# Patient Record
Sex: Male | Born: 1965 | Race: White | Hispanic: No | Marital: Single | Smoking: Current every day smoker
Health system: Southern US, Community
[De-identification: ages and names within clinical notes are randomized; demographics above are authoritative.]

## PROBLEM LIST (undated history)

## (undated) DIAGNOSIS — M542 Cervicalgia: Secondary | ICD-10-CM

## (undated) DIAGNOSIS — M549 Dorsalgia, unspecified: Secondary | ICD-10-CM

---

## 2005-06-24 ENCOUNTER — Emergency Department: Payer: Self-pay | Admitting: General Practice

## 2005-06-29 ENCOUNTER — Ambulatory Visit: Payer: Self-pay

## 2005-08-13 ENCOUNTER — Ambulatory Visit: Payer: Self-pay | Admitting: Physician Assistant

## 2005-08-31 ENCOUNTER — Encounter: Payer: Self-pay | Admitting: Physician Assistant

## 2005-09-30 ENCOUNTER — Encounter: Payer: Self-pay | Admitting: Physician Assistant

## 2008-09-13 ENCOUNTER — Emergency Department (HOSPITAL_COMMUNITY): Admission: EM | Admit: 2008-09-13 | Discharge: 2008-09-13 | Payer: Self-pay | Admitting: Emergency Medicine

## 2008-09-17 ENCOUNTER — Encounter: Payer: Self-pay | Admitting: Family Medicine

## 2008-09-30 ENCOUNTER — Encounter: Payer: Self-pay | Admitting: Family Medicine

## 2008-10-30 ENCOUNTER — Encounter: Payer: Self-pay | Admitting: Family Medicine

## 2011-08-18 ENCOUNTER — Emergency Department (HOSPITAL_COMMUNITY)
Admission: EM | Admit: 2011-08-18 | Discharge: 2011-08-18 | Disposition: A | Discharge status: Home / Self Care | Payer: Medicaid Other | Attending: Emergency Medicine | Admitting: Emergency Medicine

## 2011-08-18 DIAGNOSIS — S39012A Strain of muscle, fascia and tendon of lower back, initial encounter: Secondary | ICD-10-CM

## 2011-08-18 DIAGNOSIS — M545 Low back pain, unspecified: Secondary | ICD-10-CM | POA: Insufficient documentation

## 2011-08-18 DIAGNOSIS — X500XXA Overexertion from strenuous movement or load, initial encounter: Secondary | ICD-10-CM | POA: Insufficient documentation

## 2011-08-18 DIAGNOSIS — S335XXA Sprain of ligaments of lumbar spine, initial encounter: Secondary | ICD-10-CM | POA: Insufficient documentation

## 2011-08-18 LAB — BASIC METABOLIC PANEL
BUN: 8 mg/dL (ref 6–23)
Calcium: 9.4 mg/dL (ref 8.4–10.5)
GFR calc non Af Amer: >60 mL/min (ref >60)
Glucose, Bld: 94 mg/dL (ref 70–99)

## 2011-08-18 NOTE — ED Notes (Signed)
See down time chart

## 2011-08-18 NOTE — ED Provider Notes (Signed)
History     CSN: [id number] Arrival date & time: 08/18/2011  8:41 AM   No chief complaint on file.    (Include location/radiation/quality/duration/timing/severity/associated sxs/prior treatment) The history is provided by the patient.     No past medical history on file.   No past surgical history on file.  No family history on file.  History  Substance Use Topics  . Smoking status: Not on file  . Smokeless tobacco: Not on file  . Alcohol Use: Not on file      Review of Systems  Allergies  Review of patient's allergies indicates not on file.  Home Medications  No current outpatient prescriptions on file.  Physical Exam    There were no vitals taken for this visit.  Physical Exam  ED Course  Procedures  Results for orders placed during the hospital encounter of 08/18/11  BASIC METABOLIC PANEL      Component Value Range   Sodium 139  135 - 145 (mEq/L)   Potassium 4.6  3.5 - 5.1 (mEq/L)   Chloride 104  96 - 112 (mEq/L)   CO2 29  19 - 32 (mEq/L)   Glucose, Bld 94  70 - 99 (mg/dL)   BUN 8  6 - 23 (mg/dL)   Creatinine, Ser 8.29  0.50 - 1.35 (mg/dL)   Calcium 9.4  8.4 - 56.2 (mg/dL)   GFR calc non Af Amer >60  >60 (mL/min)   GFR calc Af Amer >60  >60 (mL/min)   No results found.   No diagnosis found.   MDM See downtime documentation paperwork for this hpi and other charting.       Candis Musa, PA 08/18/11 1057

## 2012-02-04 ENCOUNTER — Emergency Department (HOSPITAL_COMMUNITY)
Admission: EM | Admit: 2012-02-04 | Discharge: 2012-02-04 | Disposition: A | Discharge status: Home / Self Care | Payer: Medicaid Other | Attending: Emergency Medicine | Admitting: Emergency Medicine

## 2012-02-04 ENCOUNTER — Encounter (HOSPITAL_COMMUNITY): Payer: Self-pay

## 2012-02-04 DIAGNOSIS — M542 Cervicalgia: Secondary | ICD-10-CM | POA: Insufficient documentation

## 2012-02-04 MED ORDER — METHOCARBAMOL 500 MG PO TABS: 500.0000 mg | ORAL_TABLET | Freq: Two times a day (BID) | ORAL | Status: AC

## 2012-02-04 MED ORDER — OXYCODONE-ACETAMINOPHEN 5-325 MG PO TABS: 2.0000 | ORAL_TABLET | ORAL | Status: AC | PRN

## 2012-02-04 MED ORDER — PREDNISONE 10 MG PO TABS: 20.0000 mg | ORAL_TABLET | Freq: Every day | ORAL | Status: DC

## 2012-02-04 NOTE — ED Provider Notes (Signed)
History     CSN: [id number]  Arrival date & time 02/04/12  1051   First MD Initiated Contact with Patient 02/04/12 1203      Chief Complaint  Patient presents with  . Neck Pain    (Consider location/radiation/quality/duration/timing/severity/associated sxs/prior treatment) Patient is a 46 y.o. male presenting with neck pain. The history is provided by the patient.  Neck Pain    patient here complaining of neck pain has been chronic in nature worse for the past 2 weeks. No recent injury. Pain described as sharp and localized to the left side of his neck radiating down to his left scapula. Pain worse with movement better with rest. Using over-the-counter medications without relief. History of cervical disc disease, no prior surgeries. Denies any paresthesias or weakness in his arms  History reviewed. No pertinent past medical history.  History reviewed. No pertinent past surgical history.  No family history on file.  History  Substance Use Topics  . Smoking status: Never Smoker   . Smokeless tobacco: Not on file  . Alcohol Use: No      Review of Systems  HENT: Positive for neck pain.   All other systems reviewed and are negative.    Allergies  Hydrocodone  Home Medications   Current Outpatient Rx  Name Route Sig Dispense Refill  . ACETAMINOPHEN 500 MG PO TABS Oral Take 1,000 mg by mouth every 6 (six) hours as needed. Pain     . IBUPROFEN 200 MG PO TABS Oral Take 400 mg by mouth every 6 (six) hours as needed. Pain     . NAPROXEN SODIUM 220 MG PO TABS Oral Take 220 mg by mouth 2 (two) times daily with a meal.      BP 118/78  Pulse 94  Temp(Src) 98.3 F (36.8 C) (Oral)  Resp 20  SpO2 100%  Physical Exam  Nursing note and vitals reviewed. Constitutional: He is oriented to person, place, and time. He appears well-developed and well-nourished.  Non-toxic appearance. No distress.  HENT:  Head: Normocephalic and atraumatic.  Eyes: Conjunctivae, EOM and lids are  normal. Pupils are equal, round, and reactive to light.  Neck: Neck supple. Muscular tenderness present. No spinous process tenderness present. No rigidity. Decreased range of motion present. No tracheal deviation present. No mass present.    Cardiovascular: Normal rate, regular rhythm and normal heart sounds.  Exam reveals no gallop.   No murmur heard. Pulmonary/Chest: Effort normal and breath sounds normal. No stridor. No respiratory distress. He has no decreased breath sounds. He has no wheezes. He has no rhonchi. He has no rales.  Abdominal: Soft. Normal appearance and bowel sounds are normal. He exhibits no distension. There is no tenderness. There is no rebound and no CVA tenderness.  Musculoskeletal: He exhibits no edema and no tenderness.  Neurological: He is alert and oriented to person, place, and time. He has normal strength. No cranial nerve deficit or sensory deficit. GCS eye subscore is 4. GCS verbal subscore is 5. GCS motor subscore is 6.  Skin: Skin is warm and dry. No abrasion and no rash noted.  Psychiatric: He has a normal mood and affect. His speech is normal and behavior is normal.    ED Course  Procedures (including critical care time)  Labs Reviewed - No data to display No results found.   No diagnosis found.    MDM  Patient to be treated with muscle relaxants, prednisone, opiate medications. He'll followup with his PCP  Toy Baker, MD 02/04/12 1214

## 2012-02-04 NOTE — ED Notes (Signed)
Discharged home with prescriptions and written instructions

## 2012-02-04 NOTE — ED Notes (Signed)
Pt presents with neck pain "for years" that has worsened x 2 weeks.  Pt reports pain to the back of his neck that radiates into L scapula.

## 2012-02-04 NOTE — Discharge Instructions (Signed)
Degenerative Disc Disease Degenerative disc disease is a condition caused by the changes that occur in the cushions of the backbone (spinal discs) as you grow older. Spinal discs are soft and compressible discs located between the bones of the spine (vertebrae). They act like shock absorbers. Degenerative disc disease can affect the wholespine. However, the neck and lower back are most commonly affected. Many changes can occur in the spinal discs with aging, such as:  The spinal discs may dry and shrink.   Small tears may occur in the tough, outer covering of the disc (annulus).   The disc space may become smaller due to loss of water.   Abnormal growths in the bone (spurs) may occur. This can put pressure on the nerve roots exiting the spinal canal, causing pain.   The spinal canal may become narrowed.  CAUSES  Degenerative disc disease is a condition caused by the changes that occur in the spinal discs with aging. The exact cause is not known, but there is a genetic basis for many patients. Degenerative changes can occur due to loss of fluid in the disc. This makes the disc thinner and reduces the space between the backbones. Small cracks can develop in the outer layer of the disc. This can lead to the breakdown of the disc. You are more likely to get degenerative disc disease if you are overweight. Smoking cigarettes and doing heavy work such as weightlifting can also increase your risk of this condition. Degenerative changes can start after a sudden injury. Growth of bone spurs can compress the nerve roots and cause pain.  SYMPTOMS  The symptoms vary from person to person. Some people may have no pain, while others have severe pain. The pain may be so severe that it can limit your activities. The location of the pain depends on the part of your backbone that is affected. You will have neck or arm pain if a disc in the neck area is affected. You will have pain in your back, buttocks, or legs if a  disc in the lower back is affected. The pain becomes worse while bending, reaching up, or with twisting movements. The pain may start gradually and then get worse as time passes. It may also start after a major or minor injury. You may feel numbness or tingling in the arms or legs.  DIAGNOSIS  Your caregiver will ask you about your symptoms and about activities or habits that may cause the pain. He or she may also ask about any injuries, diseases, ortreatments you have had earlier. Your caregiver will examine you to check for the range of movement that is possible in the affected area, to check for strength in your extremities, and to check for sensation in the areas of the arms and legs supplied by different nerve roots. An X-ray of the spine may be taken. Your caregiver may suggest other imaging tests, such as a computerized magnetic scan (MRI), if needed.  TREATMENT  Treatment includes rest, modifying your activities, and applying ice and heat. Your caregiver may prescribe medicines to reduce your pain and may ask you to do some exercises to strengthen your back. In some cases, you may need surgery. You and your caregiver will decide on the treatment that is best for you. HOME CARE INSTRUCTIONS   Follow proper lifting and walking techniques as advised by your caregiver.   Maintain good posture.   Exercise regularly as advised.   Perform relaxation exercises.   Change your sitting,   standing, and sleeping habits as advised. Change positions frequently.   Lose weight as advised.   Stop smoking if you smoke.   Wear supportive footwear.  SEEK MEDICAL CARE IF:  The pain does not go away within 1 to 4 weeks. SEEK IMMEDIATE MEDICAL CARE IF:   The pain is severe.   You notice weakness in your arms, hands, or legs.   You begin to lose control of your bladder or bowel.  MAKE SURE YOU:   Understand these instructions.   Will watch your condition.   Will get help right away if you are not  doing well or get worse.  Document Released: 09/13/2007 Document Revised: 11/05/2011 Document Reviewed: 09/13/2007 [employer] Patient Information 2012 [employer]. 

## 2012-02-25 ENCOUNTER — Other Ambulatory Visit (HOSPITAL_COMMUNITY): Payer: Self-pay | Admitting: Family Medicine

## 2012-02-25 DIAGNOSIS — M542 Cervicalgia: Secondary | ICD-10-CM

## 2012-03-01 ENCOUNTER — Ambulatory Visit (HOSPITAL_COMMUNITY)
Admission: RE | Admit: 2012-03-01 | Discharge: 2012-03-01 | Disposition: A | Discharge status: Home / Self Care | Payer: Medicaid Other | Source: Ambulatory Visit | Attending: Family Medicine | Admitting: Family Medicine

## 2012-03-01 DIAGNOSIS — M542 Cervicalgia: Secondary | ICD-10-CM | POA: Insufficient documentation

## 2012-03-01 DIAGNOSIS — M5030 Other cervical disc degeneration, unspecified cervical region: Secondary | ICD-10-CM | POA: Insufficient documentation

## 2012-03-01 DIAGNOSIS — M5020 Other cervical disc displacement, unspecified cervical region: Secondary | ICD-10-CM | POA: Insufficient documentation

## 2012-03-01 DIAGNOSIS — M25519 Pain in unspecified shoulder: Secondary | ICD-10-CM | POA: Insufficient documentation

## 2012-06-14 ENCOUNTER — Encounter (HOSPITAL_COMMUNITY): Payer: Self-pay | Admitting: Emergency Medicine

## 2012-06-14 ENCOUNTER — Emergency Department (INDEPENDENT_AMBULATORY_CARE_PROVIDER_SITE_OTHER)
Admission: EM | Admit: 2012-06-14 | Discharge: 2012-06-14 | Disposition: A | Discharge status: Home / Self Care | Payer: Medicaid Other | Source: Home / Self Care

## 2012-06-14 DIAGNOSIS — M542 Cervicalgia: Secondary | ICD-10-CM

## 2012-06-14 HISTORY — DX: Cervicalgia: M54.2

## 2012-06-14 HISTORY — DX: Dorsalgia, unspecified: M54.9

## 2012-06-14 MED ORDER — DEXAMETHASONE 4 MG PO TABS: 4.0000 mg | ORAL_TABLET | Freq: Two times a day (BID) | ORAL | Status: AC

## 2012-06-14 MED ORDER — TRAMADOL HCL 50 MG PO TABS: 50.0000 mg | ORAL_TABLET | Freq: Four times a day (QID) | ORAL | Status: AC | PRN

## 2012-06-14 MED ORDER — DEXAMETHASONE SODIUM PHOSPHATE 10 MG/ML IJ SOLN
INTRAMUSCULAR | Status: AC
  Filled 2012-06-14: qty 1

## 2012-06-14 MED ORDER — DEXAMETHASONE SODIUM PHOSPHATE 10 MG/ML IJ SOLN
10.0000 mg | Freq: Once | INTRAMUSCULAR | Status: AC
  Administered 2012-06-14: 10 mg via INTRAMUSCULAR

## 2012-06-14 NOTE — ED Provider Notes (Signed)
Medical screening examination/treatment/procedure(s) were performed by non-physician practitioner and as supervising physician I was immediately available for consultation/collaboration.  Leslee Home, M.D.   Reuben Likes, MD 06/14/12 2220
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
## 2012-06-14 NOTE — ED Notes (Signed)
Chronic neck and arm pain.  Reports doing repetitive movement activity approx 2 weeks ago.  No fall, no injury.  Patient feels pain is worse than usual.  Pain is going further down arm, to the elbow

## 2014-12-21 ENCOUNTER — Encounter (HOSPITAL_COMMUNITY): Payer: Self-pay | Admitting: Emergency Medicine

## 2014-12-21 ENCOUNTER — Emergency Department (HOSPITAL_COMMUNITY)
Admission: EM | Admit: 2014-12-21 | Discharge: 2014-12-21 | Disposition: A | Discharge status: Home / Self Care | Payer: Self-pay | Attending: Emergency Medicine | Admitting: Emergency Medicine

## 2014-12-21 DIAGNOSIS — Z8739 Personal history of other diseases of the musculoskeletal system and connective tissue: Secondary | ICD-10-CM | POA: Insufficient documentation

## 2014-12-21 DIAGNOSIS — A64 Unspecified sexually transmitted disease: Secondary | ICD-10-CM

## 2014-12-21 DIAGNOSIS — N390 Urinary tract infection, site not specified: Secondary | ICD-10-CM | POA: Insufficient documentation

## 2014-12-21 DIAGNOSIS — Z79899 Other long term (current) drug therapy: Secondary | ICD-10-CM | POA: Insufficient documentation

## 2014-12-21 DIAGNOSIS — Z720 Tobacco use: Secondary | ICD-10-CM | POA: Insufficient documentation

## 2014-12-21 DIAGNOSIS — A638 Other specified predominantly sexually transmitted diseases: Secondary | ICD-10-CM | POA: Insufficient documentation

## 2014-12-21 LAB — URINALYSIS, ROUTINE W REFLEX MICROSCOPIC
Bilirubin Urine: NEGATIVE
GLUCOSE, UA: 100 mg/dL — AB
Ketones, ur: NEGATIVE mg/dL
Nitrite: POSITIVE — AB
PH: 5 (ref 5.0–8.0)
PROTEIN: 30 mg/dL — AB
Specific Gravity, Urine: 1.01 (ref 1.005–1.030)
UROBILINOGEN UA: 4 mg/dL — AB (ref 0.0–1.0)

## 2014-12-21 LAB — URINE MICROSCOPIC-ADD ON

## 2014-12-21 MED ORDER — SULFAMETHOXAZOLE-TRIMETHOPRIM 800-160 MG PO TABS
1.0000 | ORAL_TABLET | Freq: Once | ORAL | Status: AC
  Administered 2014-12-21: 1 via ORAL
  Filled 2014-12-21: qty 1

## 2014-12-21 MED ORDER — LIDOCAINE HCL (PF) 1 % IJ SOLN
INTRAMUSCULAR | Status: AC
  Filled 2014-12-21: qty 5

## 2014-12-21 MED ORDER — AZITHROMYCIN 250 MG PO TABS
1000.0000 mg | ORAL_TABLET | Freq: Once | ORAL | Status: AC
  Administered 2014-12-21: 1000 mg via ORAL
  Filled 2014-12-21: qty 4

## 2014-12-21 MED ORDER — SULFAMETHOXAZOLE-TRIMETHOPRIM 800-160 MG PO TABS: 1.0000 | ORAL_TABLET | Freq: Two times a day (BID) | ORAL | Status: DC

## 2014-12-21 MED ORDER — CEFTRIAXONE SODIUM 250 MG IJ SOLR
250.0000 mg | Freq: Once | INTRAMUSCULAR | Status: AC
  Administered 2014-12-21: 250 mg via INTRAMUSCULAR
  Filled 2014-12-21: qty 250

## 2014-12-21 NOTE — ED Provider Notes (Signed)
CSN: [id number]     Arrival date & time 12/21/14  1257 History  This chart was scribed for Vida Roller, MD by Tonye Royalty, ED Scribe. This patient was seen in room APFT22/APFT22 and the patient's care was started at 1:19 PM.    Chief Complaint  Patient presents with  . Dysuria   The history is provided by the patient. No language interpreter was used.    Symptoms were gradual in onset Symptoms are constant, persistent Symptoms are improved with nothing Made worse with urination Associated symptoms include penile discharge, left side pain -  No f/c/n/v/diarrhea   HPI Comments: Kevin Franklin is a 49 y.o. male who presents to the Emergency Department complaining of dysuria with onset 1 week ago which he states is not worsening but is persistent. He states he has pain to the tip of his penis even when he is not urinating. He reports associated yellow penile discharge as well as pain to his left side which is intermittent and worsening. He denies any injury. He notes he has had several  UTI's over the last 15 years. He notes his urine is red because he is taking azo. He states he has had sexual contacts that may have STIs. He denies prior diagnoses of HIV, hepatitis, or other STIs. He denies back pain, fever, chills, nausea, vomiting, or abdominal pain.  Past Medical History  Diagnosis Date  . Neck pain   . Back pain    History reviewed. No pertinent past surgical history. History reviewed. No pertinent family history. History  Substance Use Topics  . Smoking status: Current Every Day Smoker -- 1.00 packs/day    Types: Cigarettes  . Smokeless tobacco: Never Used  . Alcohol Use: No    Review of Systems  Constitutional: Negative for fever and chills.  Gastrointestinal: Negative for nausea, vomiting and abdominal pain.  Genitourinary: Positive for dysuria, flank pain, discharge and penile pain.  Musculoskeletal: Negative for back pain.  All other systems reviewed and are  negative.     Allergies  Hydrocodone  Home Medications   Prior to Admission medications   Medication Sig Start Date End Date Taking? Authorizing Provider  Cranberry-Vitamin C-Probiotic (AZO CRANBERRY PO) Take 1 tablet by mouth daily as needed (kidney infection).   Yes Historical Provider, MD  acetaminophen (TYLENOL) 500 MG tablet Take 1,000 mg by mouth every 6 (six) hours as needed. Pain     Historical Provider, MD  ibuprofen (ADVIL,MOTRIN) 200 MG tablet Take 400 mg by mouth every 6 (six) hours as needed. Pain     Historical Provider, MD  sulfamethoxazole-trimethoprim (SEPTRA DS) 800-160 MG per tablet Take 1 tablet by mouth every 12 (twelve) hours. 12/21/14   Vida Roller, MD   BP 117/57 mmHg  Pulse 72  Temp(Src) 98.3 F (36.8 C) (Oral)  Resp 18  Ht  (1.753 m)  Wt 163 lb (73.936 kg)  BMI 24.06 kg/m2  SpO2 99% Physical Exam  Constitutional: He appears well-developed and well-nourished. No distress.  HENT:  Head: Normocephalic and atraumatic.  Mouth/Throat: Oropharynx is clear and moist. No oropharyngeal exudate.  Eyes: Conjunctivae and EOM are normal. Pupils are equal, round, and reactive to light. Right eye exhibits no discharge. Left eye exhibits no discharge. No scleral icterus.  Neck: Normal range of motion. Neck supple. No JVD present. No thyromegaly present.  Cardiovascular: Normal rate, regular rhythm, normal heart sounds and intact distal pulses.  Exam reveals no gallop and no friction rub.  No murmur heard. Pulmonary/Chest: Effort normal and breath sounds normal. No respiratory distress. He has no wheezes. He has no rales.  Abdominal: Soft. Bowel sounds are normal. He exhibits no distension and no mass. There is no tenderness.  Genitourinary:  Thick yellow discharge at the urethral meatus and surrounding erythema in the periurethral area Normal appearing scrotum and testicles  Lymphadenopathy:    He has no cervical adenopathy.  Neurological: He is alert.  Coordination normal.  Skin: Skin is warm and dry. No rash noted. No erythema.  Psychiatric: He has a normal mood and affect. His behavior is normal.  Nursing note and vitals reviewed.   ED Course  Procedures (including critical care time)  DIAGNOSTIC STUDIES: Oxygen Saturation is 99% on room air, normal by my interpretation.    COORDINATION OF CARE: 1:26 PM Discussed treatment plan with patient at beside, including treatment for gonorrhea and chlamydia, UA to check for UTI, and STD testing. The patient agrees with the plan and has no further questions at this time.   Labs Review Labs Reviewed  URINALYSIS, ROUTINE W REFLEX MICROSCOPIC - Abnormal; Notable for the following:    Color, Urine ORANGE (*)    APPearance CLOUDY (*)    Glucose, UA 100 (*)    Hgb urine dipstick SMALL (*)    Protein, ur 30 (*)    Urobilinogen, UA 4.0 (*)    Nitrite POSITIVE (*)    Leukocytes, UA MODERATE (*)    All other components within normal limits  URINE CULTURE  URINE MICROSCOPIC-ADD ON  RPR  HIV ANTIBODY (ROUTINE TESTING)  GC/CHLAMYDIA PROBE AMP ([place])    Imaging Review No results found.    MDM   Final diagnoses:  STD (male)  UTI (lower urinary tract infection)    Well appearing, likely STD - swab sent, HIV and RPR sent as well.  VS normal - uA pending. UA with WBC's, rare bacteria - likely related to STD - meds as below - UCx sent.   Meds given in ED:  Medications  lidocaine (PF) (XYLOCAINE) 1 % injection (not administered)  sulfamethoxazole-trimethoprim (BACTRIM DS,SEPTRA DS) 800-160 MG per tablet 1 tablet (not administered)  cefTRIAXone (ROCEPHIN) injection 250 mg (250 mg Intramuscular Given 12/21/14 1339)  azithromycin (ZITHROMAX) tablet 1,000 mg (1,000 mg Oral Given 12/21/14 1339)    New Prescriptions   SULFAMETHOXAZOLE-TRIMETHOPRIM (SEPTRA DS) 800-160 MG PER TABLET    Take 1 tablet by mouth every 12 (twelve) hours.      I personally performed the services  described in this documentation, which was scribed in my presence. The recorded information has been reviewed and is accurate.    Vida RollerBrian D Dysen Edmondson, MD 12/21/14 1450

## 2014-12-21 NOTE — Discharge Instructions (Signed)
Pelvic Infection ° °If you have been diagnosed with a pelvic infection such as a sexually transmitted disease, you will need to be treated with antibiotics. Please take the medicines as prescribed. Some of these tests do not come back for 1-2 days in which case if they turn positive you will receive a phone call to let you know. If you are contacted and do have an infection consistent with a sexually transmitted disease, then you will need to tell any and all sexual partners that you have had in the last 6 months no so that they can be tested and treated as well. If you should develop severe or worsening pain in your abdomen or the pelvis or develop severe fevers,nausea or vomiting that prevent you from taking your medications, return to the emergency department immediately. Otherwise contact your local physician or county health department for a follow up appointment to complete STD testing including HIV and syphilis.  See the list of phone numbers below. ° °RESOURCE GUIDE ° °Dental Problems ° °Patients with Medicaid: °[facility]                     [facility] °[address]                                           [address] °Phone:  [phone]                                                  Phone:  [phone] ° °If unable to pay or uninsured, contact:  [facility] or [facility] to become qualified for the adult dental clinic. ° °Chronic Pain Problems °Contact [facility]  [phone] °Patients need to be referred by their primary care doctor. ° °Insufficient Money for Medicine °Contact [employer]:  call "211" or [facility] 271-5999. ° °No Primary Care Doctor °Call [facility]  832-8000 °Other agencies that provide inexpensive medical care °   [facility]  [phone] °   [facility]  832-7272 °   [facility]  [phone] °   [facility] Clinic  [phone] °   [facility]  [phone] ° [facility]  [phone] ° °Psychological Services ° Health  [phone] °[facility]  [phone] °[facility]   [phone] (emergency services [phone]) ° °Substance Abuse Resources °[facility]  336-882-2125 °[facility] 336-784-9470 °[facility] 336-285-9073 °[facility] 336-845-3988 °Residential & Outpatient Substance Abuse Program  800-659-3381 ° °Abuse/Neglect °[place] [facility] (336) 641-3795 °[place] [facility] Hotline 800-378-5315 (After Hours) ° °Emergency Shelter °[facility] (336) 271-5985 ° °Maternity Homes °[facility] (336) 275-9566 °[facility] (704) 372-4663 ° °MRSA Hotline #:   832-7006 ° ° ° °[place] [facility] ° °[facility] [place] [facility]     [employer]                          [facility] °[address] [place]                         [address]      [address]  °[place]                                                [place]                            [place] °Phone:  349-3220                                   Phone:  [phone]                 Phone:  342-8140 ° °[facility] °Phone:  342-8316 ° °[place] [facility] Hotline °(336) 342-1394 °(336) 342-3537 (After Hours) ° ° ° °

## 2014-12-21 NOTE — ED Notes (Addendum)
Patient with no complaints at this time. Respirations even and unlabored. Skin warm/dry. Discharge instructions reviewed with patient at this time. Patient given opportunity to voice concerns/ask questions. Instructed patient to use condom or refrain from intercourse until results from swab are relayed to him.  Also instructed that if he is positive, to make sure his partner seeks treatment.  Patient discharged at this time and left Emergency Department with steady gait.

## 2014-12-21 NOTE — ED Notes (Signed)
Pt reports burning with urination, penile discharge and urinary frequency.

## 2014-12-23 LAB — HIV ANTIBODY (ROUTINE TESTING W REFLEX): HIV-1/HIV-2 Ab: NONREACTIVE

## 2014-12-23 LAB — URINE CULTURE
COLONY COUNT: NO GROWTH
CULTURE: NO GROWTH

## 2014-12-23 LAB — RPR: RPR: NONREACTIVE

## 2014-12-24 LAB — GC/CHLAMYDIA PROBE AMP (~~LOC~~) NOT AT ARMC
Chlamydia: NEGATIVE
NEISSERIA GONORRHEA: POSITIVE — AB

## 2014-12-26 ENCOUNTER — Telehealth (HOSPITAL_BASED_OUTPATIENT_CLINIC_OR_DEPARTMENT_OTHER): Payer: Self-pay | Admitting: Emergency Medicine

## 2014-12-26 NOTE — Telephone Encounter (Signed)
Positive Gonorrhea Treated with Rocephin and Zithromax per protocol MD [facility] faxed  Attempt to contact patient, left message to call office #.

## 2014-12-27 ENCOUNTER — Telehealth (HOSPITAL_BASED_OUTPATIENT_CLINIC_OR_DEPARTMENT_OTHER): Payer: Self-pay | Admitting: Emergency Medicine

## 2014-12-31 ENCOUNTER — Telehealth: Payer: Self-pay | Admitting: *Deleted

## 2015-01-14 ENCOUNTER — Telehealth (HOSPITAL_COMMUNITY): Payer: Self-pay

## 2015-01-14 NOTE — ED Notes (Signed)
Unable to contact pt by mail or telephone. Unable to communicate lab results or treatment changes. 

## 2015-05-01 ENCOUNTER — Encounter (HOSPITAL_COMMUNITY): Payer: Self-pay

## 2015-05-01 ENCOUNTER — Emergency Department (HOSPITAL_COMMUNITY)
Admission: EM | Admit: 2015-05-01 | Discharge: 2015-05-01 | Disposition: A | Discharge status: Home / Self Care | Payer: No Typology Code available for payment source | Attending: Emergency Medicine | Admitting: Emergency Medicine

## 2015-05-01 ENCOUNTER — Emergency Department (HOSPITAL_COMMUNITY): Payer: No Typology Code available for payment source

## 2015-05-01 DIAGNOSIS — Y92410 Unspecified street and highway as the place of occurrence of the external cause: Secondary | ICD-10-CM | POA: Insufficient documentation

## 2015-05-01 DIAGNOSIS — S3992XA Unspecified injury of lower back, initial encounter: Secondary | ICD-10-CM | POA: Diagnosis present

## 2015-05-01 DIAGNOSIS — S24109A Unspecified injury at unspecified level of thoracic spinal cord, initial encounter: Secondary | ICD-10-CM | POA: Insufficient documentation

## 2015-05-01 DIAGNOSIS — S199XXA Unspecified injury of neck, initial encounter: Secondary | ICD-10-CM | POA: Insufficient documentation

## 2015-05-01 DIAGNOSIS — Z720 Tobacco use: Secondary | ICD-10-CM | POA: Insufficient documentation

## 2015-05-01 DIAGNOSIS — Y9389 Activity, other specified: Secondary | ICD-10-CM | POA: Insufficient documentation

## 2015-05-01 DIAGNOSIS — Y998 Other external cause status: Secondary | ICD-10-CM | POA: Diagnosis not present

## 2015-05-01 MED ORDER — HYDROCODONE-ACETAMINOPHEN 5-325 MG PO TABS
1.0000 | ORAL_TABLET | ORAL | Status: DC
  Filled 2015-05-01: qty 1

## 2015-05-01 MED ORDER — CYCLOBENZAPRINE HCL 5 MG PO TABS: 5.0000 mg | ORAL_TABLET | Freq: Three times a day (TID) | ORAL | Status: AC | PRN

## 2015-05-01 MED ORDER — NAPROXEN 500 MG PO TABS: 500.0000 mg | ORAL_TABLET | Freq: Two times a day (BID) | ORAL | Status: AC

## 2015-05-01 NOTE — ED Notes (Signed)
Per patient not allergic to Hydrocodone, removed as allergy. Pt history of back and neck pain.
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
## 2015-05-01 NOTE — ED Notes (Signed)
After telling this nurse earlier that he was not allergic to Hydrocodone he now states that he is, states he must have "forgot". Refused Hydrocodone.

## 2015-05-01 NOTE — Discharge Instructions (Signed)
Cervical Sprain °A cervical sprain is an injury in the neck in which the strong, fibrous tissues (ligaments) that connect your neck bones stretch or tear. Cervical sprains can range from mild to severe. Severe cervical sprains can cause the neck vertebrae to be unstable. This can lead to damage of the spinal cord and can result in serious nervous system problems. The amount of time it takes for a cervical sprain to get better depends on the cause and extent of the injury. Most cervical sprains heal in 1 to 3 weeks. °CAUSES  °Severe cervical sprains may be caused by:  °· Contact sport injuries (such as from football, rugby, wrestling, hockey, auto racing, gymnastics, diving, martial arts, or boxing).   °· Motor vehicle collisions.   °· Whiplash injuries. This is an injury from a sudden forward and backward whipping movement of the head and neck.  °· Falls.   °Mild cervical sprains may be caused by:  °· Being in an awkward position, such as while cradling a telephone between your ear and shoulder.   °· Sitting in a chair that does not offer proper support.   °· Working at a poorly designed computer station.   °· Looking up or down for long periods of time.   °SYMPTOMS  °· Pain, soreness, stiffness, or a burning sensation in the front, back, or sides of the neck. This discomfort may develop immediately after the injury or slowly, 24 hours or more after the injury.   °· Pain or tenderness directly in the middle of the back of the neck.   °· Shoulder or upper back pain.   °· Limited ability to move the neck.   °· Headache.   °· Dizziness.   °· Weakness, numbness, or tingling in the hands or arms.   °· Muscle spasms.   °· Difficulty swallowing or chewing.   °· Tenderness and swelling of the neck.   °DIAGNOSIS  °Most of the time your health care provider can diagnose a cervical sprain by taking your history and doing a physical exam. Your health care provider will ask about previous neck injuries and any known neck  problems, such as arthritis in the neck. X-rays may be taken to find out if there are any other problems, such as with the bones of the neck. Other tests, such as a CT scan or MRI, may also be needed.  °TREATMENT  °Treatment depends on the severity of the cervical sprain. Mild sprains can be treated with rest, keeping the neck in place (immobilization), and pain medicines. Severe cervical sprains are immediately immobilized. Further treatment is done to help with pain, muscle spasms, and other symptoms and may include: °· Medicines, such as pain relievers, numbing medicines, or muscle relaxants.   °· Physical therapy. This may involve stretching exercises, strengthening exercises, and posture training. Exercises and improved posture can help stabilize the neck, strengthen muscles, and help stop symptoms from returning.   °HOME CARE INSTRUCTIONS  °· Put ice on the injured area.   °¨ Put ice in a plastic bag.   °¨ Place a towel between your skin and the bag.   °¨ Leave the ice on for 15-20 minutes, 3-4 times a day.   °· If your injury was severe, you may have been given a cervical collar to wear. A cervical collar is a two-piece collar designed to keep your neck from moving while it heals. °¨ Do not remove the collar unless instructed by your health care provider. °¨ If you have long hair, keep it outside of the collar. °¨ Ask your health care provider before making any adjustments to your collar. Minor   adjustments may be required over time to improve comfort and reduce pressure on your chin or on the back of your head. °¨ If you are allowed to remove the collar for cleaning or bathing, follow your health care provider's instructions on how to do so safely. °¨ Keep your collar clean by wiping it with mild soap and water and drying it completely. If the collar you have been given includes removable pads, remove them every 1-2 days and hand wash them with soap and water. Allow them to air dry. They should be completely  dry before you wear them in the collar. °¨ If you are allowed to remove the collar for cleaning and bathing, wash and dry the skin of your neck. Check your skin for irritation or sores. If you see any, tell your health care provider. °¨ Do not drive while wearing the collar.   °· Only take over-the-counter or prescription medicines for pain, discomfort, or fever as directed by your health care provider.   °· Keep all follow-up appointments as directed by your health care provider.   °· Keep all physical therapy appointments as directed by your health care provider.   °· Make any needed adjustments to your workstation to promote good posture.   °· Avoid positions and activities that make your symptoms worse.   °· Warm up and stretch before being active to help prevent problems.   °SEEK MEDICAL CARE IF:  °· Your pain is not controlled with medicine.   °· You are unable to decrease your pain medicine over time as planned.   °· Your activity level is not improving as expected.   °SEEK IMMEDIATE MEDICAL CARE IF:  °· You develop any bleeding. °· You develop stomach upset. °· You have signs of an allergic reaction to your medicine.   °· Your symptoms get worse.   °· You develop new, unexplained symptoms.   °· You have numbness, tingling, weakness, or paralysis in any part of your body.   °MAKE SURE YOU:  °· Understand these instructions. °· Will watch your condition. °· Will get help right away if you are not doing well or get worse. °Document Released: 09/13/2007 Document Revised: 11/21/2013 Document Reviewed: 05/24/2013 °[employer]® Patient Information ©2015 [employer]. This information is not intended to replace advice given to you by your health care provider. Make sure you discuss any questions you have with your health care provider. ° °Motor Vehicle Collision °It is common to have multiple bruises and sore muscles after a motor vehicle collision (MVC). These tend to feel worse for the first 24 hours. You may have  the most stiffness and soreness over the first several hours. You may also feel worse when you wake up the first morning after your collision. After this point, you will usually begin to improve with each day. The speed of improvement often depends on the severity of the collision, the number of injuries, and the location and nature of these injuries. °HOME CARE INSTRUCTIONS °· Put ice on the injured area. °¨ Put ice in a plastic bag. °¨ Place a towel between your skin and the bag. °¨ Leave the ice on for 15-20 minutes, 3-4 times a day, or as directed by your health care provider. °· Drink enough fluids to keep your urine clear or pale yellow. Do not drink alcohol. °· Take a warm shower or bath once or twice a day. This will increase blood flow to sore muscles. °· You may return to activities as directed by your caregiver. Be careful when lifting, as this may aggravate neck or back   pain. °· Only take over-the-counter or prescription medicines for pain, discomfort, or fever as directed by your caregiver. Do not use aspirin. This may increase bruising and bleeding. °SEEK IMMEDIATE MEDICAL CARE IF: °· You have numbness, tingling, or weakness in the arms or legs. °· You develop severe headaches not relieved with medicine. °· You have severe neck pain, especially tenderness in the middle of the back of your neck. °· You have changes in bowel or bladder control. °· There is increasing pain in any area of the body. °· You have shortness of breath, light-headedness, dizziness, or fainting. °· You have chest pain. °· You feel sick to your stomach (nauseous), throw up (vomit), or sweat. °· You have increasing abdominal discomfort. °· There is blood in your urine, stool, or vomit. °· You have pain in your shoulder (shoulder strap areas). °· You feel your symptoms are getting worse. °MAKE SURE YOU: °· Understand these instructions. °· Will watch your condition. °· Will get help right away if you are not doing well or get  worse. °Document Released: 11/16/2005 Document Revised: 04/02/2014 Document Reviewed: 04/15/2011 °Exit[employer]® Patient Information ©2015 [employer]. This information is not intended to replace advice given to you by your health care provider. Make sure you discuss any questions you have with your health care provider. ° °
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
## 2015-09-14 ENCOUNTER — Encounter: Payer: Self-pay | Admitting: Emergency Medicine

## 2015-09-14 ENCOUNTER — Emergency Department
Admission: EM | Admit: 2015-09-14 | Discharge: 2015-09-14 | Disposition: A | Discharge status: Home / Self Care | Payer: 59 | Attending: Emergency Medicine | Admitting: Emergency Medicine

## 2015-09-14 ENCOUNTER — Emergency Department: Payer: 59

## 2015-09-14 DIAGNOSIS — M79605 Pain in left leg: Secondary | ICD-10-CM | POA: Diagnosis present

## 2015-09-14 DIAGNOSIS — M7122 Synovial cyst of popliteal space [Baker], left knee: Secondary | ICD-10-CM | POA: Insufficient documentation

## 2015-09-14 DIAGNOSIS — I10 Essential (primary) hypertension: Secondary | ICD-10-CM | POA: Diagnosis not present

## 2015-09-14 DIAGNOSIS — Z720 Tobacco use: Secondary | ICD-10-CM | POA: Diagnosis not present

## 2015-09-14 DIAGNOSIS — M7989 Other specified soft tissue disorders: Secondary | ICD-10-CM

## 2015-09-14 MED ORDER — KETOROLAC TROMETHAMINE 30 MG/ML IJ SOLN
30.0000 mg | Freq: Once | INTRAMUSCULAR | Status: AC
  Administered 2015-09-14: 30 mg via INTRAMUSCULAR
  Filled 2015-09-14: qty 1

## 2015-09-14 MED ORDER — NAPROXEN 500 MG PO TABS: 500.0000 mg | ORAL_TABLET | Freq: Two times a day (BID) | ORAL | Status: DC

## 2015-09-14 NOTE — ED Provider Notes (Signed)
[facility] Emergency Department Provider Note  ____________________________________________  Time seen: 5:00 PM  I have reviewed the triage vital signs and the nursing notes.   HISTORY  Chief Complaint Leg Pain    HPI Kevin Franklin is a 49 y.o. male who complains of left leg pain and swelling that has progressively worsened gradually over the past 2-3 weeks. 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 It feels like tightness and is worse with weightbearing and walking. He denies any trauma or falls or other injuries.  No fever chills chest pain or shortness of breath.   Past Medical History  Diagnosis Date  . Neck pain   . Back pain   . Hypertension      There are no active problems to display for this patient.    History reviewed. No pertinent past surgical history.   Current Outpatient Rx  Name  Route  Sig  Dispense  Refill  . acetaminophen (TYLENOL) 500 MG tablet   Oral   Take 500 mg by mouth every 6 (six) hours as needed for mild pain or moderate pain. Pain         . cyclobenzaprine (FLEXERIL) 5 MG tablet   Oral   Take 1 tablet (5 mg total) by mouth 3 (three) times daily as needed for muscle spasms.   21 tablet   0   . naproxen (NAPROSYN) 500 MG tablet   Oral   Take 1 tablet (500 mg total) by mouth 2 (two) times daily.   30 tablet   0   . naproxen (NAPROSYN) 500 MG tablet   Oral   Take 1 tablet (500 mg total) by mouth 2 (two) times daily with a meal.   20 tablet   0      Allergies Hydrocodone-acetaminophen   History reviewed. No pertinent family history.  Social History Social History  Substance Use Topics  . Smoking status: Current Every Day Smoker -- 1.00 packs/day    Types: Cigarettes  . Smokeless tobacco: Never Used  . Alcohol Use: No    Review of Systems  Constitutional:   No fever or chills. No weight changes Eyes:   No blurry vision or double vision.  ENT:   No sore  throat. Cardiovascular:   No chest pain. Respiratory:   No dyspnea or cough. Gastrointestinal:   Negative for abdominal pain, vomiting and diarrhea.  No BRBPR or melena. Genitourinary:   Negative for dysuria, urinary retention, bloody urine, or difficulty urinating. Musculoskeletal:   left leg pain and swelling.n. Skin:   Negative for rash. Neurological:   Negative for headaches, focal weakness or numbness. Psychiatric:  No anxiety or depression.   Endocrine:  No hot/cold intolerance, changes in energy, or sleep difficulty.  10-point ROS otherwise negative.  ____________________________________________   PHYSICAL EXAM:  VITAL SIGNS: ED Triage Vitals  Enc Vitals Group     BP 09/14/15 1639 131/63 mmHg     Pulse Rate 09/14/15 1639 95     Resp 09/14/15 1639 18     Temp 09/14/15 1639 98.3 F (36.8 C)     Temp Source 09/14/15 1639 Oral     SpO2 09/14/15 1639 98 %     Weight 09/14/15 1639 170 lb (77.111 kg)     Height 09/14/15 1639 5\' 9"  (1.753 m)     Head Cir --      Peak Flow --      Pain Score 09/14/15 1642 8  Pain Loc --      Pain Edu? --      Excl. in GC? --      Constitutional:   Alert and oriented. Well appearing and in no distress. Eyes:   No scleral icterus. No conjunctival pallor. PERRL. EOMI ENT   Head:   Normocephalic and atraumatic.   Nose:   No congestion/rhinnorhea. No septal hematoma   Mouth/Throat:   MMM, no pharyngeal erythema. No peritonsillar mass. No uvula shift.   Neck:   No stridor. No SubQ emphysema. No meningismus. Hematological/Lymphatic/Immunilogical:   No cervical lymphadenopathy. Cardiovascular:   RRR. Normal and symmetric distal pulses are present in all extremities. No murmurs, rubs, or gallops. Respiratory:   Normal respiratory effort without tachypnea nor retractions. Breath sounds are clear and equal bilaterally. No wheezes/rales/rhonchi. Gastrointestinal:   Soft and nontender. No distention. There is no CVA tenderness.  No  rebound, rigidity, or guarding. Genitourinary:   deferred Musculoskeletal:   Left calf nonpitting edema. It is tender to the touch. There is diffuse mild erythema without warmth. Good distal pulses. Ankle and foot are unremarkable. Right leg is normal Neurologic:   Normal speech and language.  CN 2-10 normal. Motor grossly intact. No pronator drift.  Normal gait. No gross focal neurologic deficits are appreciated.  Skin:    Skin is warm, dry and intact. No rash noted.  No petechiae, purpura, or bullae. Psychiatric:   Mood and affect are normal. Speech and behavior are normal. Patient exhibits appropriate insight and judgment.  ____________________________________________    LABS (pertinent positives/negatives) (all labs ordered are listed, but only abnormal results are displayed) Labs Reviewed - No data to display ____________________________________________   EKG    ____________________________________________    RADIOLOGY  Ultrasound left leg negative for DVT. Positive Baker cyst  ____________________________________________   PROCEDURES   ____________________________________________   INITIAL IMPRESSION / ASSESSMENT AND PLAN / ED COURSE  Pertinent labs & imaging results that were available during my care of the patient were reviewed by me and considered in my medical decision making (see chart for details).  Patient well appearing no acute distress medically stable with normal vital signs. Presents with left leg pain which is found to be related to a Baker's cyst. No evidence of DVT. We'll start him on NSAIDs and have him follow-up with primary care in one week.     ____________________________________________   FINAL CLINICAL IMPRESSION(S) / ED DIAGNOSES  Final diagnoses:  Left leg swelling  Baker cyst, left      Sharman Cheek, MD 09/14/15 1931

## 2015-09-14 NOTE — ED Notes (Deleted)
Pt reports Leg pain for the past 2 days before his left leg "locked up " on him states he is unable to bend his leg. Denies injury states this has happened to him once before. Blood pressure 155/112 pt states it has been months since he has taken his blood pressure medication

## 2015-09-14 NOTE — Discharge Instructions (Signed)
Baker Cyst A Baker cyst is a sac-like structure that forms in the back of the knee. It is filled with the same fluid that is located in your knee. This fluid lubricates the bones and cartilage of the knee and allows them to move over each other more easily. CAUSES  When the knee becomes injured or inflamed, increased fluid forms in the knee. When this happens, the joint lining is pushed out behind the knee and forms the Baker cyst. This cyst may also be caused by inflammation from arthritic conditions and infections. SIGNS AND SYMPTOMS  A Baker cyst usually has no symptoms. When the cyst is substantially enlarged:  You may feel pressure behind the knee, stiffness in the knee, or a mass in the area behind the knee.  You may develop pain, redness, and swelling in the calf. This can suggest a blood clot and requires evaluation by your health care provider. DIAGNOSIS  A Baker cyst is most often found during an ultrasound exam. This exam may have been performed for other reasons, and the cyst was found incidentally. Sometimes an MRI is used. This picks up other problems within a joint that an ultrasound exam may not. If the Baker cyst developed immediately after an injury, X-ray exams may be used to diagnose the cyst. TREATMENT  The treatment depends on the cause of the cyst. Anti-inflammatory medicines and rest often will be prescribed. If the cyst is caused by a bacterial infection, antibiotic medicines may be prescribed.  HOME CARE INSTRUCTIONS   If the cyst was caused by an injury, for the first 24 hours, keep the injured leg elevated on 2 pillows while lying down.  For the first 24 hours while you are awake, apply ice to the injured area:  Put ice in a plastic bag.  Place a towel between your skin and the bag.  Leave the ice on for 20 minutes, 2-3 times a day.  Only take over-the-counter or prescription medicines for pain, discomfort, or fever as directed by your health care  provider.  Only take antibiotic medicine as directed. Make sure to finish it even if you start to feel better. MAKE SURE YOU:   Understand these instructions.  Will watch your condition.  Will get help right away if you are not doing well or get worse.   This information is not intended to replace advice given to you by your health care provider. Make sure you discuss any questions you have with your health care provider.   Document Released: 11/16/2005 Document Revised: 09/06/2013 Document Reviewed: 06/28/2013 Elsevier Interactive Patient Education 2016 Else[employer]  Edema Edema is an abnormal buildup of fluids. It is more common in your legs and thighs. Painless swelling of the feet and ankles is more likely as a person ages. It also is common in looser skin, like around your eyes. HOME CARE   Keep the affected body part above the level of the heart while lying down.  Do not sit still or stand for a long time.  Do not put anything right under your knees when you lie down.  Do not wear tight clothes on your upper legs.  Exercise your legs to help the puffiness (swelling) go down.  Wear elastic bandages or support stockings as told by your doctor.  A low-salt diet may help lessen the puffiness.  Only take medicine as told by your doctor. GET HELP IF:  Treatment is not working.  You have heart, liver, or kidney disease and  notice that your skin looks puffy or shiny.  You have puffiness in your legs that does not get better when you raise your legs.  You have sudden weight gain for no reason. GET HELP RIGHT AWAY IF:   You have shortness of breath or chest pain.  You cannot breathe when you lie down.  You have pain, redness, or warmth in the areas that are puffy.  You have heart, liver, or kidney disease and get edema all of a sudden.  You have a fever and your symptoms get worse all of a sudden. MAKE SURE YOU:   Understand these instructions.  Will watch  your condition.  Will get help right away if you are not doing well or get worse.   This information is not intended to replace advice given to you by your health care provider. Make sure you discuss any questions you have with your health care provider.   Document Released: 05/04/2008 Document Revised: 11/21/2013 Document Reviewed: 09/08/2013 Elsevier Interactive Patient Education [employer] Else[employer] Inc.

## 2015-09-14 NOTE — ED Notes (Addendum)
States left leg pain, states it feels like a charlie horse all the time, states swelling to left calf, been going on for 3 weeks, tightness upon assessment, pt limping, does smoke, drives a truck for a living

## 2015-11-18 ENCOUNTER — Ambulatory Visit (HOSPITAL_COMMUNITY)
Admission: RE | Admit: 2015-11-18 | Discharge: 2015-11-18 | Disposition: A | Discharge status: Home / Self Care | Payer: 59 | Source: Ambulatory Visit | Attending: Family Medicine | Admitting: Family Medicine

## 2015-11-18 ENCOUNTER — Other Ambulatory Visit (HOSPITAL_COMMUNITY): Payer: Self-pay | Admitting: Family Medicine

## 2015-11-18 DIAGNOSIS — M25562 Pain in left knee: Secondary | ICD-10-CM | POA: Insufficient documentation

## 2015-11-18 DIAGNOSIS — M25462 Effusion, left knee: Secondary | ICD-10-CM | POA: Insufficient documentation

## 2015-11-18 DIAGNOSIS — S83207A Unspecified tear of unspecified meniscus, current injury, left knee, initial encounter: Secondary | ICD-10-CM

## 2016-01-29 ENCOUNTER — Encounter: Payer: Self-pay | Admitting: *Deleted

## 2016-03-16 ENCOUNTER — Ambulatory Visit (INDEPENDENT_AMBULATORY_CARE_PROVIDER_SITE_OTHER): Payer: 59 | Admitting: Orthopedic Surgery

## 2016-03-16 VITALS — BP 118/76 | HR 68 | Ht 69.0 in | Wt 160.0 lb

## 2016-03-16 DIAGNOSIS — M25462 Effusion, left knee: Secondary | ICD-10-CM

## 2016-03-16 MED ORDER — INDOMETHACIN 25 MG PO CAPS: 25.0000 mg | ORAL_CAPSULE | Freq: Three times a day (TID) | ORAL | Status: AC

## 2016-03-16 NOTE — Patient Instructions (Signed)

## 2016-03-16 NOTE — Progress Notes (Signed)
Chief Complaint  Patient presents with  . New Patient (Initial Visit)    Left knee pain, no injury   HPI atraumatic onset of left knee pain and swelling.  Details location again left knee quality dull ache severity moderate to severe duration greater than 2 weeks timing constant contacts worsening  Review of Systems  Constitutional: Negative for fever, chills and malaise/fatigue.  Neurological: Negative for tingling, tremors and sensory change.    Past Medical History  Diagnosis Date  . Neck pain   . Back pain   . Hypertension     No past surgical history on file. No family history on file. Social History  Substance Use Topics  . Smoking status: Current Every Day Smoker -- 1.00 packs/day    Types: Cigarettes  . Smokeless tobacco: Never Used  . Alcohol Use: No    Current outpatient prescriptions:  .  naproxen (NAPROSYN) 500 MG tablet, Take 1 tablet (500 mg total) by mouth 2 (two) times daily., Disp: 30 tablet, Rfl: 0 .  oxyCODONE-acetaminophen (PERCOCET/ROXICET) 5-325 MG tablet, Take 1 tablet by mouth every 4 (four) hours as needed for severe pain., Disp: , Rfl:  .  acetaminophen (TYLENOL) 500 MG tablet, Take 500 mg by mouth every 6 (six) hours as needed for mild pain or moderate pain. Reported on 03/16/2016, Disp: , Rfl:  .  cyclobenzaprine (FLEXERIL) 5 MG tablet, Take 1 tablet (5 mg total) by mouth 3 (three) times daily as needed for muscle spasms. (Patient not taking: Reported on 03/16/2016), Disp: 21 tablet, Rfl: 0 .  indomethacin (INDOCIN) 25 MG capsule, Take 1 capsule (25 mg total) by mouth 3 (three) times daily with meals., Disp: 84 capsule, Rfl: 0  BP 118/76 mmHg  Pulse 68  Ht 5\' 9"  (1.753 m)  Wt 160 lb (72.576 kg)  BMI 23.62 kg/m2  Physical Exam  Constitutional: He is oriented to person, place, and time. He appears well-developed and well-nourished. No distress.  Cardiovascular: Normal rate and intact distal pulses.   Musculoskeletal:  Gait observations shows no  significant limp  Neurological: He is alert and oriented to person, place, and time.  Skin: Skin is warm and dry. No rash noted. He is not diaphoretic. No erythema. No pallor.  Psychiatric: He has a normal mood and affect. His behavior is normal. Judgment and thought content normal.    Ortho Exam Examination of the right knee inspection and palpation reveals no pain swelling or tenderness, range of motion is normal. The ligaments of the knee are stable. Muscle tone and strength are normal. No atrophy. Skin is normal. Dorsal pulses normal in the foot no edema normal sensation  Left knee mild joint line tenderness nonspecific full range of motion painful with extremes of flexion all ligaments are stable strength and muscle tone normal without atrophy. Skin normal no rash. No lacerations. No prior surgery scars. Pulse and temperature normal no edema in the lower leg and ankle and sensation is normal.  ASSESSMENT: My personal interpretation of the images:  I don't see any major arthritic changes here  The report is included by reference after review    PLAN Inject left knee  I put him on Indocin. Come back in a few weeks  Procedure note left knee injection verbal consent was obtained to inject left knee joint  Timeout was completed to confirm the site of injection  The medications used were 40 mg of Depo-Medrol and 1% lidocaine 3 cc  Anesthesia was provided by ethyl chloride and  the skin was prepped with alcohol.  After cleaning the skin with alcohol a 20-gauge needle was used to inject the left knee joint. There were no complications. A sterile bandage was applied.

## 2016-04-13 ENCOUNTER — Ambulatory Visit: Payer: 59 | Admitting: Orthopedic Surgery

## 2016-04-15 ENCOUNTER — Ambulatory Visit: Payer: 59 | Admitting: Orthopedic Surgery

## 2016-05-21 ENCOUNTER — Ambulatory Visit (INDEPENDENT_AMBULATORY_CARE_PROVIDER_SITE_OTHER): Payer: 59 | Admitting: Orthopedic Surgery

## 2016-05-21 VITALS — BP 107/70 | HR 63 | Ht 69.0 in | Wt 154.6 lb

## 2016-05-21 DIAGNOSIS — S83242A Other tear of medial meniscus, current injury, left knee, initial encounter: Secondary | ICD-10-CM | POA: Diagnosis not present

## 2016-05-21 NOTE — Progress Notes (Signed)
Patient ID: Kevin Franklin, male   DOB: 1966/01/14, 50 y.o.   MRN: [id number]  Chief Complaint  Patient presents with  . Follow-up    left knee effusion    HPI - 50 year old male presents with atraumatic onset of left knee pain and swelling partially relieved with naproxen. The patient complains of a dull aching pain in his left knee moderate in severity now of over 8 weeks duration  ROS  As noted in our prior note no fever no chills no malaise no fatigue negative for numbness tingling or sensory changes   BP 107/70 mmHg  Pulse 63  Ht 5\' 9"  (1.753 m)  Wt 154 lb 9.6 oz (70.126 kg)  BMI 22.82 kg/m2  Ortho Exam   A/P  Medical decision-making  At this point I think it's pertinent to order MRI of his left knee to check for any intra-articular pathology such as meniscal tear or ligament damage most likely torn meniscus to get him back to his normal state of health    Fuller CanadaStanley Cesare Sumlin, MD 05/21/2016 5:31 PM

## 2016-06-03 ENCOUNTER — Ambulatory Visit (HOSPITAL_COMMUNITY)
Admission: RE | Admit: 2016-06-03 | Discharge: 2016-06-03 | Disposition: A | Discharge status: Home / Self Care | Payer: PRIVATE HEALTH INSURANCE | Source: Ambulatory Visit | Attending: Orthopedic Surgery | Admitting: Orthopedic Surgery

## 2016-06-03 DIAGNOSIS — S83242A Other tear of medial meniscus, current injury, left knee, initial encounter: Secondary | ICD-10-CM

## 2016-06-03 DIAGNOSIS — M25462 Effusion, left knee: Secondary | ICD-10-CM | POA: Diagnosis not present

## 2016-06-03 DIAGNOSIS — M659 Synovitis and tenosynovitis, unspecified: Secondary | ICD-10-CM | POA: Diagnosis not present

## 2016-06-22 ENCOUNTER — Ambulatory Visit: Payer: 59 | Admitting: Orthopedic Surgery

## 2016-07-23 ENCOUNTER — Encounter: Payer: Self-pay | Admitting: Orthopedic Surgery

## 2016-07-30 ENCOUNTER — Encounter: Payer: Self-pay | Admitting: Orthopedic Surgery

## 2016-07-30 ENCOUNTER — Ambulatory Visit (INDEPENDENT_AMBULATORY_CARE_PROVIDER_SITE_OTHER): Payer: 59 | Admitting: Orthopedic Surgery

## 2016-07-30 VITALS — BP 104/70 | HR 70 | Ht 70.0 in | Wt 160.0 lb

## 2016-07-30 DIAGNOSIS — M8430XD Stress fracture, unspecified site, subsequent encounter for fracture with routine healing: Secondary | ICD-10-CM

## 2016-07-30 DIAGNOSIS — M25562 Pain in left knee: Secondary | ICD-10-CM

## 2016-07-30 NOTE — Patient Instructions (Signed)
BRACE KNEE X 8 WEEKS

## 2016-07-30 NOTE — Progress Notes (Signed)
Patient ID: Kevin Franklin, male   DOB: 07-23-1966, 50 y.o.   MRN: [id number]  MRI FOLLOW UP  Chief Complaint  Patient presents with  . Follow-up    MRI FOLLOW UP LEFT KNEE    HPI Kevin Franklin is a 50 y.o. male.   Presented with left knee pain for 10 months. Treated with 3 anti-inflammatories no improvement   MRI OF THE left knee   ROS  Denies fever   Physical Exam  NONE  Data  Independent image interpretation the MRI showed stress reaction medial femoral condyle and patella  The report was read as follows   Other:   IMPRESSION: 1. Diffuse and fairly marked edema like signal abnormality in the patella along with patchy marrow edema in the medial femoral condyle. As discussed above this could be some type occupational stress reaction, unusual longitudinal stress fracture or possibly infection. There is a fairly significant amount of surrounding inflammation in Hoffa's fat and also in the quadriceps fat pad. A joint effusion and mild synovitis is also noted. Recommend clinical correlation to exclude infection. 2. Intact ligamentous structures and no meniscal tears. 3. Mild degenerative chondrosis/chondromalacia as described above.     Electronically Signed   By: Rudie MeyerP.  Gallerani M.D.   On: 06/04/2016 09:12  Left knee pain with stress fracture  The plan is to wear hinged knee brace 8 weeks  Kevin CanadaStanley Jaret Coppedge, MD 07/30/2016 5:20 PM

## 2016-09-28 ENCOUNTER — Ambulatory Visit: Payer: PRIVATE HEALTH INSURANCE | Admitting: Orthopedic Surgery

## 2016-10-19 ENCOUNTER — Ambulatory Visit: Payer: PRIVATE HEALTH INSURANCE | Admitting: Orthopedic Surgery

## 2016-12-14 ENCOUNTER — Ambulatory Visit: Payer: PRIVATE HEALTH INSURANCE | Admitting: Orthopedic Surgery

## 2016-12-14 ENCOUNTER — Encounter: Payer: Self-pay | Admitting: Orthopedic Surgery

## 2017-02-23 ENCOUNTER — Ambulatory Visit (INDEPENDENT_AMBULATORY_CARE_PROVIDER_SITE_OTHER): Payer: PRIVATE HEALTH INSURANCE | Admitting: General Surgery

## 2017-02-23 ENCOUNTER — Encounter: Payer: Self-pay | Admitting: General Surgery

## 2017-02-23 VITALS — BP 122/65 | HR 58 | Temp 98.2°F | Resp 18 | Ht 69.0 in | Wt 161.0 lb

## 2017-02-23 DIAGNOSIS — L723 Sebaceous cyst: Secondary | ICD-10-CM | POA: Diagnosis not present

## 2017-02-23 NOTE — Progress Notes (Signed)
Kevin Franklin; [id number]; 10-28-1966   HPI Patient is a 51 year old white male who presents with an enlarging mass on his neck. He states has been present for some time now, but has recently increased in size and is causing him discomfort. He does describe some shooting pain down the left arm. He is having no drainage from this. He was referred by Dr. Delbert Harnesson Diego for further evaluation and treatment. He states he had this lanced 14 years ago and was told it was a boil. His pain level is 7 at this time. Past Medical History:  Diagnosis Date  . Back pain   . Hypertension   . Neck pain     No past surgical history on file.  No family history on file.  Current Outpatient Prescriptions on File Prior to Visit  Medication Sig Dispense Refill  . acetaminophen (TYLENOL) 500 MG tablet Take 500 mg by mouth every 6 (six) hours as needed for mild pain or moderate pain. Reported on 03/16/2016    . cyclobenzaprine (FLEXERIL) 5 MG tablet Take 1 tablet (5 mg total) by mouth 3 (three) times daily as needed for muscle spasms. 21 tablet 0  . indomethacin (INDOCIN) 25 MG capsule Take 1 capsule (25 mg total) by mouth 3 (three) times daily with meals. 84 capsule 0  . naproxen (NAPROSYN) 500 MG tablet Take 1 tablet (500 mg total) by mouth 2 (two) times daily. 30 tablet 0  . oxyCODONE-acetaminophen (PERCOCET/ROXICET) 5-325 MG tablet Take 1 tablet by mouth every 4 (four) hours as needed for severe pain.     No current facility-administered medications on file prior to visit.     Allergies  Allergen Reactions  . Hydrocodone-Acetaminophen Hives and Rash    History  Alcohol Use No    History  Smoking Status  . Current Every Day Smoker  . Packs/day: 1.00  . Types: Cigarettes  Smokeless Tobacco  . Never Used    Review of Systems  Constitutional: Positive for malaise/fatigue.  HENT: Negative.   Eyes: Negative.   Respiratory: Negative.   Cardiovascular: Negative.   Gastrointestinal: Negative.    Genitourinary: Negative.   Musculoskeletal: Negative.   Skin: Positive for rash.  Neurological: Negative.   Endo/Heme/Allergies: Negative.   Psychiatric/Behavioral: Negative.     Objective   Vitals:   02/23/17 1129  BP: 122/65  Pulse: (!) 58  Resp: 18  Temp: 98.2 F (36.8 C)    Physical Exam  Constitutional: He is oriented to person, place, and time and well-developed, well-nourished, and in no distress.  HENT:  Head: Normocephalic and atraumatic.  Neck: Neck supple.  2 x 3 cm ovoid subcutaneous mass present with a healed scar overlying it. It is at the base of the posterior neck. It is somewhat tender to touch. No drainage is noted. No erythema is noted.  Cardiovascular: Normal rate, regular rhythm and normal heart sounds.   No murmur heard. Pulmonary/Chest: Effort normal and breath sounds normal. He has no wheezes. He has no rales.  Lymphadenopathy:    He has no cervical adenopathy.  Neurological: He is alert and oriented to person, place, and time.  Skin: Skin is warm and dry. No rash noted. No erythema.  Vitals reviewed.   Assessment   Sebaceous cyst, neck, symptomatic  Plan   Scheduled for excision of sebaceous cyst, neck on 03/12/2017. The risks and benefits of the procedure including bleeding, infection, and recurrence of the cyst were fully explained to the patient, who gave informed consent.

## 2017-02-23 NOTE — H&P (Signed)
Kevin Franklin; [id number]; 08/23/1966   HPI Patient is a 50-year-old white male who presents with an enlarging mass on his neck. He states has been present for some time now, but has recently increased in size and is causing him discomfort. He does describe some shooting pain down the left arm. He is having no drainage from this. He was referred by Dr. Don Diego for further evaluation and treatment. He states he had this lanced 14 years ago and was told it was a boil. His pain level is 7 at this time. Past Medical History:  Diagnosis Date  . Back pain   . Hypertension   . Neck pain     No past surgical history on file.  No family history on file.  Current Outpatient Prescriptions on File Prior to Visit  Medication Sig Dispense Refill  . acetaminophen (TYLENOL) 500 MG tablet Take 500 mg by mouth every 6 (six) hours as needed for mild pain or moderate pain. Reported on 03/16/2016    . cyclobenzaprine (FLEXERIL) 5 MG tablet Take 1 tablet (5 mg total) by mouth 3 (three) times daily as needed for muscle spasms. 21 tablet 0  . indomethacin (INDOCIN) 25 MG capsule Take 1 capsule (25 mg total) by mouth 3 (three) times daily with meals. 84 capsule 0  . naproxen (NAPROSYN) 500 MG tablet Take 1 tablet (500 mg total) by mouth 2 (two) times daily. 30 tablet 0  . oxyCODONE-acetaminophen (PERCOCET/ROXICET) 5-325 MG tablet Take 1 tablet by mouth every 4 (four) hours as needed for severe pain.     No current facility-administered medications on file prior to visit.     Allergies  Allergen Reactions  . Hydrocodone-Acetaminophen Hives and Rash    History  Alcohol Use No    History  Smoking Status  . Current Every Day Smoker  . Packs/day: 1.00  . Types: Cigarettes  Smokeless Tobacco  . Never Used    Review of Systems  Constitutional: Positive for malaise/fatigue.  HENT: Negative.   Eyes: Negative.   Respiratory: Negative.   Cardiovascular: Negative.   Gastrointestinal: Negative.    Genitourinary: Negative.   Musculoskeletal: Negative.   Skin: Positive for rash.  Neurological: Negative.   Endo/Heme/Allergies: Negative.   Psychiatric/Behavioral: Negative.     Objective   Vitals:   02/23/17 1129  BP: 122/65  Pulse: (!) 58  Resp: 18  Temp: 98.2 F (36.8 C)    Physical Exam  Constitutional: He is oriented to person, place, and time and well-developed, well-nourished, and in no distress.  HENT:  Head: Normocephalic and atraumatic.  Neck: Neck supple.  2 x 3 cm ovoid subcutaneous mass present with a healed scar overlying it. It is at the base of the posterior neck. It is somewhat tender to touch. No drainage is noted. No erythema is noted.  Cardiovascular: Normal rate, regular rhythm and normal heart sounds.   No murmur heard. Pulmonary/Chest: Effort normal and breath sounds normal. He has no wheezes. He has no rales.  Lymphadenopathy:    He has no cervical adenopathy.  Neurological: He is alert and oriented to person, place, and time.  Skin: Skin is warm and dry. No rash noted. No erythema.  Vitals reviewed.   Assessment   Sebaceous cyst, neck, symptomatic  Plan   Scheduled for excision of sebaceous cyst, neck on 03/12/2017. The risks and benefits of the procedure including bleeding, infection, and recurrence of the cyst were fully explained to the patient, who gave informed consent.   risks and benefits of the procedure including bleeding, infection, and recurrence of the cyst were fully explained to the patient, who gave informed consent.

## 2017-02-23 NOTE — Patient Instructions (Signed)
Epidermal Cyst An epidermal cyst is a small, painless lump under your skin. It may be called an epidermal inclusion cyst or an infundibular cyst. The cyst contains a grayish-white, bad-smelling substance (keratin). It is important not to pop epidermal cysts yourself. These cysts are usually harmless (benign), but they can get infected. Symptoms of infection may include:  Redness.  Inflammation.  Tenderness.  Warmth.  Fever.  A grayish-white, bad-smelling substance draining from the cyst.  Pus draining from the cyst. Follow these instructions at home:  Take over-the-counter and prescription medicines only as told by your doctor.  If you were prescribed an antibiotic, use it as told by your doctor. Do not stop using the antibiotic even if you start to feel better.  Keep the area around your cyst clean and dry.  Wear loose, dry clothing.  Do not try to pop your cyst.  Avoid touching your cyst.  Check your cyst every day for signs of infection.  Keep all follow-up visits as told by your doctor. This is important. How is this prevented?  Wear clean, dry, clothing.  Avoid wearing tight clothing.  Keep your skin clean and dry. Shower or take baths every day.  Wash your body with a benzoyl peroxide wash when you shower or bathe. Contact a health care provider if:  Your cyst has symptoms of infection.  Your condition is not improving or is getting worse.  You have a cyst that looks different from other cysts you have had.  You have a fever. Get help right away if:  Redness spreads from the cyst into the surrounding area. This information is not intended to replace advice given to you by your health care provider. Make sure you discuss any questions you have with your health care provider. Document Released: 12/24/2004 Document Revised: 07/15/2016 Document Reviewed: 09/18/2015 Elsevier Interactive Patient Education  2017 [employer]  

## 2017-03-03 NOTE — Patient Instructions (Signed)
Kevin Franklin  03/03/2017     @   Your procedure is scheduled on 03/12/2017.  Report to [facility] at 8:45 A.M.  Call this number if you have problems the morning of surgery:  [phone]   Remember:  Do not eat food or drink liquids after midnight.  Take these medicines the morning of surgery with A SIP OF WATER Flexeril, Indocin, Percocet if needed   Do not wear jewelry, make-up or nail polish.  Do not wear lotions, powders, or perfumes, or deoderant.  Do not shave 48 hours prior to surgery.  Men may shave face and neck.  Do not bring valuables to the hospital.  [facility] : [facility] is not responsible for any belongings or valuables.  Contacts, dentures or bridgework may not be worn into surgery.  Leave your suitcase in the car.  After surgery it may be brought to your room.  For patients admitted to the hospital, discharge time will be determined by your treatment team.  Patients discharged the day of surgery will not be allowed to drive home.    Please read over the following fact sheets that you were given. Surgical Site Infection Prevention and Anesthesia Post-op Instructions     PATIENT INSTRUCTIONS POST-ANESTHESIA  IMMEDIATELY FOLLOWING SURGERY:  Do not drive or operate machinery for the first twenty four hours after surgery.  Do not make any important decisions for twenty four hours after surgery or while taking narcotic pain medications or sedatives.  If you develop intractable nausea and vomiting or a severe headache please notify your doctor immediately.  FOLLOW-UP:  Please make an appointment with your surgeon as instructed. You do not need to follow up with anesthesia unless specifically instructed to do so.  WOUND CARE INSTRUCTIONS (if applicable):  Keep a dry clean dressing on the anesthesia/puncture wound site if there is drainage.  Once the wound has quit draining you may leave it open to air.  Generally you should leave the bandage intact for  twenty four hours unless there is drainage.  If the epidural site drains for more than 36-48 hours please call the anesthesia department.  QUESTIONS?:  Please feel free to call your physician or the hospital operator if you have any questions, and they will be happy to assist you.      Excision of Skin Lesions Excision of a skin lesion refers to the removal of a section of skin by making small cuts (incisions) in the skin. This procedure may be done to remove a cancerous (malignant) or noncancerous (benign) growth on the skin. It is typically done to treat or prevent cancer or infection. It may also be done to improve cosmetic appearance. The procedure may be done to remove:  Cancerous growths, such as basal cell carcinoma, squamous cell carcinoma, or melanoma.  Noncancerous growths, such as a cyst or lipoma.  Growths, such as moles or skin tags, which may be removed for cosmetic reasons. Various excision or surgical techniques may be used depending on your condition, the location of the lesion, and your overall health. Tell a health care provider about:  Any allergies you have.  All medicines you are taking, including vitamins, herbs, eye drops, creams, and over-the-counter medicines.  Any problems you or family members have had with anesthetic medicines.  Any blood disorders you have.  Any surgeries you have had.  Any medical conditions you have.  Whether you are pregnant or may be pregnant. What are the risks? Generally, this is a  safe procedure. However, problems may occur, including:  Bleeding.  Infection.  Scarring.  Recurrence of the cyst, lipoma, or cancer.  Changes in skin sensation or appearance, such as discoloration or swelling.  Reaction to the anesthetics.  Allergic reaction to surgical materials or ointments.  Damage to nerves, blood vessels, muscles, or other structures.  Continued pain. What happens before the procedure?  Ask your health care  provider about:  Changing or stopping your regular medicines. This is especially important if you are taking diabetes medicines or blood thinners.  Taking medicines such as aspirin and ibuprofen. These medicines can thin your blood. Do not take these medicines before your procedure if your health care provider instructs you not to.  You may be asked to take certain medicines.  You may be asked to stop smoking.  You may have an exam or testing.  Plan to have someone take you home after the procedure.  Plan to have someone help you with activities during recovery. What happens during the procedure?  To reduce your risk of infection:  Your health care team will wash or sanitize their hands.  Your skin will be washed with soap.  You will be given a medicine to numb the area (local anesthetic).  One of the following excision techniques will be performed.  At the end of any of these procedures, antibiotic ointment will be applied as needed. Each of the following techniques may vary among health care providers and hospitals. Complete Surgical Excision  The area of skin that needs to be removed will be marked with a pen. Using a small scalpel or scissors, the surgeon will gently cut around and under the lesion until it is completely removed. The lesion will be placed in a fluid and sent to the lab for examination. If necessary, bleeding will be controlled with a device that delivers heat (electrocautery). The edges of the wound may be stitched (sutured) together, and a bandage (dressing) will be applied. This procedure may be performed to treat a cancerous growth or a noncancerous cyst or lesion. Excision of a Cyst  The surgeon will make an incision on the cyst. The entire cyst will be removed through the incision. The incision may be closed with sutures. Shave Excision  During shave excision, the surgeon will use a small blade or an electrically heated loop instrument to shave off the  lesion. This may be done to remove a mole or a skin tag. The wound will usually be left to heal on its own without sutures. Punch Excision  During punch excision, the surgeon will use a small tool that is like a cookie cutter or a hole punch to cut a circle shape out of the skin. The outer edges of the skin will be sutured together. This may be done to remove a mole or a scar or to perform a biopsy of the lesion. Mohs Micrographic Surgery  During Mohs micrographic surgery, layers of the lesion will be removed with a scalpel or a loop instrument and will be examined right away under a microscope. Layers will be removed until all of the abnormal or cancerous tissue has been removed. This procedure is minimally invasive, and it ensures the best cosmetic outcome. It involves the removal of as little normal tissue as possible. Mohs is usually done to treat skin cancer, such as basal cell carcinoma or squamous cell carcinoma, particularly on the face and ears. Depending on the size of the surgical wound, it may be sutured closed.  What happens after the procedure?  Return to your normal activities as told by your health care provider.  Talk with your health care provider to discuss any test results, treatment options, and if necessary, the need for more tests. This information is not intended to replace advice given to you by your health care provider. Make sure you discuss any questions you have with your health care provider. Document Released: 02/10/2010 Document Revised: 04/23/2016 Document Reviewed: 01/02/2015 Elsevier Interactive Patient Education  2017 [employer].

## 2017-03-05 ENCOUNTER — Encounter (HOSPITAL_COMMUNITY)
Admission: RE | Admit: 2017-03-05 | Discharge: 2017-03-05 | Disposition: A | Discharge status: Home / Self Care | Payer: PRIVATE HEALTH INSURANCE | Source: Ambulatory Visit | Attending: General Surgery | Admitting: General Surgery

## 2017-03-19 NOTE — Patient Instructions (Signed)
TAYSON SCHNELLE  03/19/2017     @   Your procedure is scheduled on  03/26/2017   Report to [facility] at  615   A.M.  Call this number if you have problems the morning of surgery:  [phone]   Remember:  Do not eat food or drink liquids after midnight.  Take these medicines the morning of surgery with A SIP OF WATER  flexaril, indocin, oxycodone.   Do not wear jewelry, make-up or nail polish.  Do not wear lotions, powders, or perfumes, or deoderant.  Do not shave 48 hours prior to surgery.  Men may shave face and neck.  Do not bring valuables to the hospital.  [facility] is not responsible for any belongings or valuables.  Contacts, dentures or bridgework may not be worn into surgery.  Leave your suitcase in the car.  After surgery it may be brought to your room.  For patients admitted to the hospital, discharge time will be determined by your treatment team.  Patients discharged the day of surgery will not be allowed to drive home.   Name and phone number of your driver:   family Special instructions:  None  Please read over the following fact sheets that you were given. Anesthesia Post-op Instructions and Care and Recovery After Surgery       Epidermal Cyst An epidermal cyst is a small, painless lump under your skin. It may be called an epidermal inclusion cyst or an infundibular cyst. The cyst contains a grayish-white, bad-smelling substance (keratin). It is important not to pop epidermal cysts yourself. These cysts are usually harmless (benign), but they can get infected. Symptoms of infection may include:  Redness.  Inflammation.  Tenderness.  Warmth.  Fever.  A grayish-white, bad-smelling substance draining from the cyst.  Pus draining from the cyst. Follow these instructions at home:  Take over-the-counter and prescription medicines only as told by your doctor.  If you were prescribed an antibiotic, use it as told by  your doctor. Do not stop using the antibiotic even if you start to feel better.  Keep the area around your cyst clean and dry.  Wear loose, dry clothing.  Do not try to pop your cyst.  Avoid touching your cyst.  Check your cyst every day for signs of infection.  Keep all follow-up visits as told by your doctor. This is important. How is this prevented?  Wear clean, dry, clothing.  Avoid wearing tight clothing.  Keep your skin clean and dry. Shower or take baths every day.  Wash your body with a benzoyl peroxide wash when you shower or bathe. Contact a health care provider if:  Your cyst has symptoms of infection.  Your condition is not improving or is getting worse.  You have a cyst that looks different from other cysts you have had.  You have a fever. Get help right away if:  Redness spreads from the cyst into the surrounding area. This information is not intended to replace advice given to you by your health care provider. Make sure you discuss any questions you have with your health care provider. Document Released: 12/24/2004 Document Revised: 07/15/2016 Document Reviewed: 09/18/2015 Elsevier Interactive Patient Education  2017 [employer] Epidermal Cyst Removal, Care After Refer to this sheet in the next few weeks. These instructions provide you with information about caring for yourself after your procedure. Your health care provider may also give you more specific instructions.  Your treatment has been planned according to current medical practices, but problems sometimes occur. Call your health care provider if you have any problems or questions after your procedure. What can I expect after the procedure? After the procedure, it is common to have:  Soreness in the area where your cyst was removed.  Tightness or itching from your skin sutures. Follow these instructions at home:  Take medicines only as directed by your health care provider.  If you were  prescribed an antibiotic medicine, finish all of it even if you start to feel better.  Use antibiotic ointment as directed by your health care provider. Follow the instructions carefully.  There are many different ways to close and cover an incision, including stitches (sutures), skin glue, and adhesive strips. Follow your health care provider's instructions about:  Incision care.  Bandage (dressing) changes and removal.  Incision closure removal.  Keep the bandage (dressing) dry until your health care provider says that it can be removed. Take sponge baths only. Ask your health care provider when you can start showering or taking a bath.  After your dressing is off, check your incision every day for signs of infection. Watch for:  Redness, swelling, or pain.  Fluid, blood, or pus.  You can return to your normal activities. Do not do anything that stretches or puts pressure on your incision.  You can return to your normal diet.  Keep all follow-up visits as directed by your health care provider. This is important. Contact a health care provider if:  You have a fever.  Your incision bleeds.  You have redness, swelling, or pain in the incision area.  You have fluid, blood, or pus coming from your incision.  Your cyst comes back after surgery. This information is not intended to replace advice given to you by your health care provider. Make sure you discuss any questions you have with your health care provider. Document Released: 12/07/2014 Document Revised: 04/23/2016 Document Reviewed: 08/01/2014 Elsevier Interactive Patient Education  2017 [employer] PATIENT INSTRUCTIONS POST-ANESTHESIA  IMMEDIATELY FOLLOWING SURGERY:  Do not drive or operate machinery for the first twenty four hours after surgery.  Do not make any important decisions for twenty four hours after surgery or while taking narcotic pain medications or sedatives.  If you develop intractable nausea and  vomiting or a severe headache please notify your doctor immediately.  FOLLOW-UP:  Please make an appointment with your surgeon as instructed. You do not need to follow up with anesthesia unless specifically instructed to do so.  WOUND CARE INSTRUCTIONS (if applicable):  Keep a dry clean dressing on the anesthesia/puncture wound site if there is drainage.  Once the wound has quit draining you may leave it open to air.  Generally you should leave the bandage intact for twenty four hours unless there is drainage.  If the epidural site drains for more than 36-48 hours please call the anesthesia department.  QUESTIONS?:  Please feel free to call your physician or the hospital operator if you have any questions, and they will be happy to assist you.

## 2017-03-22 ENCOUNTER — Other Ambulatory Visit: Payer: Self-pay

## 2017-03-22 ENCOUNTER — Encounter (HOSPITAL_COMMUNITY): Payer: Self-pay

## 2017-03-22 ENCOUNTER — Encounter (HOSPITAL_COMMUNITY)
Admission: RE | Admit: 2017-03-22 | Discharge: 2017-03-22 | Disposition: A | Discharge status: Home / Self Care | Payer: 59 | Source: Ambulatory Visit | Attending: General Surgery | Admitting: General Surgery

## 2017-03-22 DIAGNOSIS — M549 Dorsalgia, unspecified: Secondary | ICD-10-CM | POA: Insufficient documentation

## 2017-03-22 DIAGNOSIS — M542 Cervicalgia: Secondary | ICD-10-CM | POA: Diagnosis not present

## 2017-03-22 DIAGNOSIS — L723 Sebaceous cyst: Secondary | ICD-10-CM | POA: Diagnosis not present

## 2017-03-22 DIAGNOSIS — Z01812 Encounter for preprocedural laboratory examination: Secondary | ICD-10-CM | POA: Diagnosis present

## 2017-03-22 DIAGNOSIS — Z886 Allergy status to analgesic agent status: Secondary | ICD-10-CM | POA: Insufficient documentation

## 2017-03-22 DIAGNOSIS — Z79891 Long term (current) use of opiate analgesic: Secondary | ICD-10-CM | POA: Insufficient documentation

## 2017-03-22 DIAGNOSIS — F17210 Nicotine dependence, cigarettes, uncomplicated: Secondary | ICD-10-CM | POA: Insufficient documentation

## 2017-03-22 DIAGNOSIS — Z79899 Other long term (current) drug therapy: Secondary | ICD-10-CM | POA: Diagnosis not present

## 2017-03-22 DIAGNOSIS — Z01810 Encounter for preprocedural cardiovascular examination: Secondary | ICD-10-CM | POA: Insufficient documentation

## 2017-03-22 DIAGNOSIS — R9431 Abnormal electrocardiogram [ECG] [EKG]: Secondary | ICD-10-CM | POA: Diagnosis not present

## 2017-03-22 LAB — CBC WITH DIFFERENTIAL/PLATELET
Basophils Absolute: 0 10*3/uL (ref 0.0–0.1)
Basophils Relative: 1 %
Eosinophils Absolute: 0.3 10*3/uL (ref 0.0–0.7)
Eosinophils Relative: 4 %
HEMATOCRIT: 39.3 % (ref 39.0–52.0)
HEMOGLOBIN: 13.3 g/dL (ref 13.0–17.0)
LYMPHS PCT: 42 %
Lymphs Abs: 2.9 10*3/uL (ref 0.7–4.0)
MCH: 30.6 pg (ref 26.0–34.0)
MCHC: 33.8 g/dL (ref 30.0–36.0)
MCV: 90.6 fL (ref 78.0–100.0)
Monocytes Absolute: 0.3 10*3/uL (ref 0.1–1.0)
Monocytes Relative: 5 %
NEUTROS PCT: 48 %
Neutro Abs: 3.3 10*3/uL (ref 1.7–7.7)
Platelets: 134 10*3/uL — ABNORMAL LOW (ref 150–400)
RBC: 4.34 MIL/uL (ref 4.22–5.81)
RDW: 13.3 % (ref 11.5–15.5)
WBC: 6.8 10*3/uL (ref 4.0–10.5)

## 2017-03-22 LAB — BASIC METABOLIC PANEL
ANION GAP: 7 (ref 5–15)
BUN: 9 mg/dL (ref 6–20)
CHLORIDE: 100 mmol/L — AB (ref 101–111)
CO2: 29 mmol/L (ref 22–32)
Calcium: 8.7 mg/dL — ABNORMAL LOW (ref 8.9–10.3)
Creatinine, Ser: 1.01 mg/dL (ref 0.61–1.24)
GFR calc non Af Amer: >60 mL/min (ref >60)
Glucose, Bld: 68 mg/dL (ref 65–99)
POTASSIUM: 3.5 mmol/L (ref 3.5–5.1)
SODIUM: 136 mmol/L (ref 135–145)

## 2017-03-23 ENCOUNTER — Inpatient Hospital Stay (HOSPITAL_COMMUNITY): Admission: RE | Admit: 2017-03-23 | Payer: PRIVATE HEALTH INSURANCE | Source: Ambulatory Visit

## 2017-03-26 ENCOUNTER — Encounter (HOSPITAL_COMMUNITY): Payer: Self-pay

## 2017-03-26 ENCOUNTER — Encounter (HOSPITAL_COMMUNITY)
Admission: RE | Admit: 2017-03-26 | Discharge: 2017-03-26 | Disposition: A | Discharge status: Home / Self Care | Payer: PRIVATE HEALTH INSURANCE | Source: Ambulatory Visit | Attending: General Surgery | Admitting: General Surgery

## 2017-03-29 ENCOUNTER — Ambulatory Visit (HOSPITAL_COMMUNITY): Payer: PRIVATE HEALTH INSURANCE | Admitting: Anesthesiology

## 2017-03-29 ENCOUNTER — Ambulatory Visit (HOSPITAL_COMMUNITY)
Admission: RE | Admit: 2017-03-29 | Discharge: 2017-03-29 | Disposition: A | Discharge status: Home / Self Care | Payer: PRIVATE HEALTH INSURANCE | Source: Ambulatory Visit | Attending: General Surgery | Admitting: General Surgery

## 2017-03-29 ENCOUNTER — Encounter (HOSPITAL_COMMUNITY): Payer: Self-pay | Admitting: *Deleted

## 2017-03-29 ENCOUNTER — Encounter (HOSPITAL_COMMUNITY)
Admission: RE | Disposition: A | Discharge status: Home / Self Care | Payer: Self-pay | Source: Ambulatory Visit | Attending: General Surgery

## 2017-03-29 DIAGNOSIS — Z885 Allergy status to narcotic agent status: Secondary | ICD-10-CM | POA: Diagnosis not present

## 2017-03-29 DIAGNOSIS — Z79899 Other long term (current) drug therapy: Secondary | ICD-10-CM | POA: Insufficient documentation

## 2017-03-29 DIAGNOSIS — F17210 Nicotine dependence, cigarettes, uncomplicated: Secondary | ICD-10-CM | POA: Diagnosis not present

## 2017-03-29 DIAGNOSIS — Z791 Long term (current) use of non-steroidal anti-inflammatories (NSAID): Secondary | ICD-10-CM | POA: Diagnosis not present

## 2017-03-29 DIAGNOSIS — L723 Sebaceous cyst: Secondary | ICD-10-CM | POA: Diagnosis present

## 2017-03-29 DIAGNOSIS — I10 Essential (primary) hypertension: Secondary | ICD-10-CM | POA: Insufficient documentation

## 2017-03-29 HISTORY — PX: CYST EXCISION: SHX5701

## 2017-03-29 SURGERY — CYST REMOVAL
Anesthesia: General | Site: Neck

## 2017-03-29 MED ORDER — PROPOFOL 10 MG/ML IV BOLUS
INTRAVENOUS | Status: AC
  Filled 2017-03-29: qty 20

## 2017-03-29 MED ORDER — POVIDONE-IODINE 10 % OINT PACKET
TOPICAL_OINTMENT | CUTANEOUS | Status: DC | PRN
  Administered 2017-03-29: 1 via TOPICAL

## 2017-03-29 MED ORDER — SODIUM CHLORIDE 0.9 % IR SOLN
Status: DC | PRN
  Administered 2017-03-29: 1000 mL

## 2017-03-29 MED ORDER — OXYCODONE-ACETAMINOPHEN 5-325 MG PO TABS: 1.0000 | ORAL_TABLET | ORAL | 0 refills | Status: AC | PRN

## 2017-03-29 MED ORDER — CEFAZOLIN SODIUM-DEXTROSE 2-4 GM/100ML-% IV SOLN
2.0000 g | INTRAVENOUS | Status: DC
  Filled 2017-03-29: qty 100

## 2017-03-29 MED ORDER — PROPOFOL 10 MG/ML IV BOLUS
INTRAVENOUS | Status: DC | PRN
  Administered 2017-03-29: 75 mg via INTRAVENOUS
  Administered 2017-03-29: 40 mg via INTRAVENOUS
  Administered 2017-03-29: 150 mg via INTRAVENOUS

## 2017-03-29 MED ORDER — POVIDONE-IODINE 10 % EX OINT
TOPICAL_OINTMENT | CUTANEOUS | Status: AC
  Filled 2017-03-29: qty 1

## 2017-03-29 MED ORDER — CHLORHEXIDINE GLUCONATE CLOTH 2 % EX PADS: 6.0000 | MEDICATED_PAD | Freq: Once | CUTANEOUS | Status: DC

## 2017-03-29 MED ORDER — LACTATED RINGERS IV SOLN
INTRAVENOUS | Status: DC
  Administered 2017-03-29: 09:00:00 via INTRAVENOUS

## 2017-03-29 MED ORDER — BUPIVACAINE HCL (PF) 0.5 % IJ SOLN
INTRAMUSCULAR | Status: AC
  Filled 2017-03-29: qty 30

## 2017-03-29 MED ORDER — FENTANYL CITRATE (PF) 100 MCG/2ML IJ SOLN
INTRAMUSCULAR | Status: AC
  Filled 2017-03-29: qty 2

## 2017-03-29 MED ORDER — MIDAZOLAM HCL 5 MG/5ML IJ SOLN
INTRAMUSCULAR | Status: DC | PRN
  Administered 2017-03-29: 2 mg via INTRAVENOUS

## 2017-03-29 MED ORDER — MIDAZOLAM HCL 2 MG/2ML IJ SOLN
INTRAMUSCULAR | Status: AC
  Filled 2017-03-29: qty 2

## 2017-03-29 MED ORDER — KETOROLAC TROMETHAMINE 30 MG/ML IJ SOLN
30.0000 mg | Freq: Once | INTRAMUSCULAR | Status: AC
  Administered 2017-03-29: 30 mg via INTRAVENOUS

## 2017-03-29 MED ORDER — KETOROLAC TROMETHAMINE 30 MG/ML IJ SOLN
INTRAMUSCULAR | Status: AC
  Filled 2017-03-29: qty 1

## 2017-03-29 MED ORDER — FENTANYL CITRATE (PF) 100 MCG/2ML IJ SOLN
25.0000 ug | INTRAMUSCULAR | Status: AC
  Administered 2017-03-29 (×2): 25 ug via INTRAVENOUS

## 2017-03-29 MED ORDER — BUPIVACAINE HCL 0.5 % IJ SOLN
INTRAMUSCULAR | Status: DC | PRN
  Administered 2017-03-29: 4 mL

## 2017-03-29 MED ORDER — LIDOCAINE HCL 1 % IJ SOLN
INTRAMUSCULAR | Status: DC | PRN
  Administered 2017-03-29: 30 mg via INTRADERMAL

## 2017-03-29 MED ORDER — FENTANYL CITRATE (PF) 100 MCG/2ML IJ SOLN: 25.0000 ug | INTRAMUSCULAR | Status: DC | PRN

## 2017-03-29 MED ORDER — MIDAZOLAM HCL 2 MG/2ML IJ SOLN
1.0000 mg | INTRAMUSCULAR | Status: AC
  Administered 2017-03-29: 2 mg via INTRAVENOUS

## 2017-03-29 MED ORDER — LIDOCAINE HCL (PF) 1 % IJ SOLN
INTRAMUSCULAR | Status: AC
  Filled 2017-03-29: qty 10

## 2017-03-29 MED ORDER — FENTANYL CITRATE (PF) 100 MCG/2ML IJ SOLN
INTRAMUSCULAR | Status: DC | PRN
  Administered 2017-03-29: 50 ug via INTRAVENOUS
  Administered 2017-03-29: 25 ug via INTRAVENOUS

## 2017-03-29 SURGICAL SUPPLY — 31 items
BAG HAMPER (MISCELLANEOUS) ×3 IMPLANT
CLOTH BEACON ORANGE TIMEOUT ST (SAFETY) ×3 IMPLANT
COVER LIGHT HANDLE STERIS (MISCELLANEOUS) ×6 IMPLANT
DECANTER SPIKE VIAL GLASS SM (MISCELLANEOUS) ×3 IMPLANT
DERMABOND ADVANCED (GAUZE/BANDAGES/DRESSINGS)
DERMABOND ADVANCED .7 DNX12 (GAUZE/BANDAGES/DRESSINGS) IMPLANT
DRAPE EENT ADH APERT 31X51 STR (DRAPES) ×3 IMPLANT
ELECT NEEDLE TIP 2.8 STRL (NEEDLE) ×3 IMPLANT
ELECT REM PT RETURN 9FT ADLT (ELECTROSURGICAL) ×3
ELECTRODE REM PT RTRN 9FT ADLT (ELECTROSURGICAL) ×1 IMPLANT
FORMALIN 10 PREFIL 120ML (MISCELLANEOUS) ×3 IMPLANT
GLOVE BIOGEL PI IND STRL 7.0 (GLOVE) ×1 IMPLANT
GLOVE BIOGEL PI INDICATOR 7.0 (GLOVE) ×2
GLOVE SURG SS PI 7.5 STRL IVOR (GLOVE) ×6 IMPLANT
GOWN STRL REUS W/TWL LRG LVL3 (GOWN DISPOSABLE) ×6 IMPLANT
KIT ROOM TURNOVER APOR (KITS) ×3 IMPLANT
MANIFOLD NEPTUNE II (INSTRUMENTS) ×3 IMPLANT
NEEDLE HYPO 25X1 1.5 SAFETY (NEEDLE) ×3 IMPLANT
NS IRRIG 1000ML POUR BTL (IV SOLUTION) ×3 IMPLANT
PACK MINOR (CUSTOM PROCEDURE TRAY) ×3 IMPLANT
PAD ARMBOARD 7.5X6 YLW CONV (MISCELLANEOUS) ×3 IMPLANT
SET BASIN LINEN APH (SET/KITS/TRAYS/PACK) ×3 IMPLANT
SPONGE GAUZE 2X2 8PLY STER LF (GAUZE/BANDAGES/DRESSINGS) ×2
SPONGE GAUZE 2X2 8PLY STRL LF (GAUZE/BANDAGES/DRESSINGS) ×4 IMPLANT
SUT ETHILON 3 0 FSL (SUTURE) IMPLANT
SUT ETHILON 4 0 PS 2 18 (SUTURE) ×6 IMPLANT
SUT PROLENE 4 0 PS 2 18 (SUTURE) IMPLANT
SUT VIC AB 3-0 SH 27 (SUTURE)
SUT VIC AB 3-0 SH 27X BRD (SUTURE) IMPLANT
SUT VIC AB 4-0 PS2 27 (SUTURE) IMPLANT
SYR CONTROL 10ML LL (SYRINGE) ×3 IMPLANT

## 2017-03-29 NOTE — Anesthesia Preprocedure Evaluation (Signed)
Anesthesia Evaluation  Patient identified by MRN, date of birth, ID band Patient awake    Reviewed: Allergy & Precautions, NPO status , Patient's Chart, lab work & pertinent test results  Airway Mallampati: II  TM Distance: >3 FB Neck ROM: Limited    Dental  (+) Teeth Intact   Pulmonary Current Smoker,    Pulmonary exam normal breath sounds clear to auscultation       Cardiovascular negative cardio ROS Normal cardiovascular exam Rhythm:Regular Rate:Normal     Neuro/Psych    GI/Hepatic negative GI ROS,   Endo/Other    Renal/GU      Musculoskeletal  (+) Arthritis , Cervical and lumbar issues.   Abdominal   Peds  Hematology   Anesthesia Other Findings   Reproductive/Obstetrics                             Anesthesia Physical Anesthesia Plan  ASA: II  Anesthesia Plan: General   Post-op Pain Management:    Induction: Intravenous  Airway Management Planned: LMA  Additional Equipment:   Intra-op Plan:   Post-operative Plan: Extubation in OR  Informed Consent: I have reviewed the patients History and Physical, chart, labs and discussed the procedure including the risks, benefits and alternatives for the proposed anesthesia with the patient or authorized representative who has indicated his/her understanding and acceptance.     Plan Discussed with:   Anesthesia Plan Comments:         Anesthesia Quick Evaluation

## 2017-03-29 NOTE — Anesthesia Procedure Notes (Signed)
Procedure Name: LMA Insertion Date/Time: 03/29/2017 10:32 AM Performed by: Despina Hidden Pre-anesthesia Checklist: Patient identified, Patient being monitored, Emergency Drugs available, Timeout performed and Suction available Patient Re-evaluated:Patient Re-evaluated prior to inductionOxygen Delivery Method: Circle System Utilized Preoxygenation: Pre-oxygenation with 100% oxygen Intubation Type: IV induction Ventilation: Mask ventilation without difficulty LMA: LMA inserted LMA Size: 5.0 Number of attempts: 2 Placement Confirmation: positive ETCO2 and breath sounds checked- equal and bilateral Tube secured with: Tape Dental Injury: Teeth and Oropharynx as per pre-operative assessment  Comments: 4 LMA with profound air leak....easily changed to #5.

## 2017-03-29 NOTE — Discharge Instructions (Signed)
Epidermal Cyst An epidermal cyst is a small, painless lump under your skin. It may be called an epidermal inclusion cyst or an infundibular cyst. The cyst contains a grayish-white, bad-smelling substance (keratin). It is important not to pop epidermal cysts yourself. These cysts are usually harmless (benign), but they can get infected. Symptoms of infection may include:  Redness.  Inflammation.  Tenderness.  Warmth.  Fever.  A grayish-white, bad-smelling substance draining from the cyst.  Pus draining from the cyst. Follow these instructions at home:  Take over-the-counter and prescription medicines only as told by your doctor.  If you were prescribed an antibiotic, use it as told by your doctor. Do not stop using the antibiotic even if you start to feel better.  Keep the area around your cyst clean and dry.  Wear loose, dry clothing.  Do not try to pop your cyst.  Avoid touching your cyst.  Check your cyst every day for signs of infection.  Keep all follow-up visits as told by your doctor. This is important. How is this prevented?  Wear clean, dry, clothing.  Avoid wearing tight clothing.  Keep your skin clean and dry. Shower or take baths every day.  Wash your body with a benzoyl peroxide wash when you shower or bathe. Contact a health care provider if:  Your cyst has symptoms of infection.  Your condition is not improving or is getting worse.  You have a cyst that looks different from other cysts you have had.  You have a fever. Get help right away if:  Redness spreads from the cyst into the surrounding area. This information is not intended to replace advice given to you by your health care provider. Make sure you discuss any questions you have with your health care provider. Document Released: 12/24/2004 Document Revised: 07/15/2016 Document Reviewed: 09/18/2015 Elsevier Interactive Patient Education  2017 [employer] Inc[employer]   PATIENT  INSTRUCTIONS POST-ANESTHESIA  IMMEDIATELY FOLLOWING SURGERY:  Do not drive or operate machinery for the first twenty four hours after surgery.  Do not make any important decisions for twenty four hours after surgery or while taking narcotic pain medications or sedatives.  If you develop intractable nausea and vomiting or a severe headache please notify your doctor immediately.  FOLLOW-UP:  Please make an appointment with your surgeon as instructed. You do not need to follow up with anesthesia unless specifically instructed to do so.  WOUND CARE INSTRUCTIONS (if applicable):  Keep a dry clean dressing on the anesthesia/puncture wound site if there is drainage.  Once the wound has quit draining you may leave it open to air.  Generally you should leave the bandage intact for twenty four hours unless there is drainage.  If the epidural site drains for more than 36-48 hours please call the anesthesia department.  QUESTIONS?:  Please feel free to call your physician or the hospital operator if you have any questions, and they will be happy to assist you.

## 2017-03-29 NOTE — Op Note (Signed)
Patient:  Kevin Franklin  DOB:  02-22-1966  MRN:  [id number]   Preop Diagnosis:  3 cm sebaceous cyst, neck  Postop Diagnosis:  Same  Procedure:  Excision of 3 cm cyst, neck  Surgeon:  [name], M.D.  Anes:  Gen.  Indications:  Patient is a 51 year old white male who presents with a sebaceous cyst on the back of his neck. This is recurrent in nature. It is tender to touch. The risks and benefits of the procedure including bleeding, infection, and the possibility of recurrence of the cyst were fully explained to the patient, who gave informed consent.  Procedure note:  The patient was placed in the right lateral decubitus position after general anesthesia was administered. The posterior neck was prepped and draped using usual sterile technique with DuraPrep. Surgical site confirmation was performed.  An incision was made over the sebaceous cyst which was at the base of the posterior neck along the midline. The dissection was taken down to the subcutaneous tissue. The sebaceous cyst was found. It was excised in total without difficulty. It was disposed of. Wound was irrigated normal saline. The wound was injected with 0.5% Sensorcaine. The skin was closed using a 4-0 nylon interrupted suture. Betadine ointment and dry sterile dressing were applied.  All tape and needle counts were correct at the end of the procedure. The patient was awakened and transferred to PACU in stable condition.  Complications:  None  EBL:  Minimal  Specimen:  None

## 2017-03-29 NOTE — Anesthesia Postprocedure Evaluation (Signed)
Anesthesia Post Note  Patient: Kevin Franklin  Procedure(s) Performed: Procedure(s) (LRB): 3 CM CYST EXCISION NECK (N/A)  Patient location during evaluation: PACU Anesthesia Type: General Level of consciousness: awake and patient cooperative Pain management: pain level controlled Vital Signs Assessment: post-procedure vital signs reviewed and stable Respiratory status: spontaneous breathing, nonlabored ventilation and respiratory function stable Cardiovascular status: blood pressure returned to baseline Postop Assessment: no signs of nausea or vomiting Anesthetic complications: no     Last Vitals:  Vitals:   03/29/17 1020 03/29/17 1119  BP:    Resp: 17   Temp:  36.7 C    Last Pain:  Vitals:   03/29/17 1119  TempSrc:   PainSc: Asleep                 Rosalita Carey J

## 2017-03-29 NOTE — H&P (Signed)
Kevin Franklin; [id number]; [birth year]   HPI Patient is a 51 year old white male who presents with an enlarging mass on his neck. He states has been present for some time now, but has recently increased in size and is causing him discomfort. He does describe some shooting pain down the left arm. He is having no drainage from this. He was referred by Dr. Delbert Harness for further evaluation and treatment. He states he had this lanced 14 years ago and was told it was a boil. His pain level is 7 at this time.     Past Medical History:  Diagnosis Date  . Back pain   . Hypertension   . Neck pain     No past surgical history on file.  No family history on file.        Current Outpatient Prescriptions on File Prior to Visit  Medication Sig Dispense Refill  . acetaminophen (TYLENOL) 500 MG tablet Take 500 mg by mouth every 6 (six) hours as needed for mild pain or moderate pain. Reported on 03/16/2016    . cyclobenzaprine (FLEXERIL) 5 MG tablet Take 1 tablet (5 mg total) by mouth 3 (three) times daily as needed for muscle spasms. 21 tablet 0  . indomethacin (INDOCIN) 25 MG capsule Take 1 capsule (25 mg total) by mouth 3 (three) times daily with meals. 84 capsule 0  . naproxen (NAPROSYN) 500 MG tablet Take 1 tablet (500 mg total) by mouth 2 (two) times daily. 30 tablet 0  . oxyCODONE-acetaminophen (PERCOCET/ROXICET) 5-325 MG tablet Take 1 tablet by mouth every 4 (four) hours as needed for severe pain.     No current facility-administered medications on file prior to visit.         Allergies  Allergen Reactions  . Hydrocodone-Acetaminophen Hives and Rash       History  Alcohol Use No        History  Smoking Status  . Current Every Day Smoker  . Packs/day: 1.00  . Types: Cigarettes  Smokeless Tobacco  . Never Used    Review of Systems  Constitutional: Positive for malaise/fatigue.  HENT: Negative.   Eyes: Negative.   Respiratory: Negative.   Cardiovascular:  Negative.   Gastrointestinal: Negative.   Genitourinary: Negative.   Musculoskeletal: Negative.   Skin: Positive for rash.  Neurological: Negative.   Endo/Heme/Allergies: Negative.   Psychiatric/Behavioral: Negative.     Objective      Vitals:   02/23/17 1129  BP: 122/65  Pulse: (!) 58  Resp: 18  Temp: 98.2 F (36.8 C)    Physical Exam  Constitutional: He is oriented to person, place, and time and well-developed, well-nourished, and in no distress.  HENT:  Head: Normocephalic and atraumatic.  Neck: Neck supple.  2 x 3 cm ovoid subcutaneous mass present with a healed scar overlying it. It is at the base of the posterior neck. It is somewhat tender to touch. No drainage is noted. No erythema is noted.  Cardiovascular: Normal rate, regular rhythm and normal heart sounds.   No murmur heard. Pulmonary/Chest: Effort normal and breath sounds normal. He has no wheezes. He has no rales.  Lymphadenopathy:    He has no cervical adenopathy.  Neurological: He is alert and oriented to person, place, and time.  Skin: Skin is warm and dry. No rash noted. No erythema.  Vitals reviewed.   Assessment   Sebaceous cyst, neck, symptomatic  Plan   Scheduled for excision of sebaceous cyst, neck on 03/29/2017. The  risks and benefits of the procedure including bleeding, infection, and recurrence of the cyst were fully explained to the patient, who gave informed consent.

## 2017-03-29 NOTE — Interval H&P Note (Signed)
History and Physical Interval Note:  03/29/2017 10:01 AM  Kevin Franklin  has presented today for surgery, with the diagnosis of sebaceous cyst on neck  The various methods of treatment have been discussed with the patient and family. After consideration of risks, benefits and other options for treatment, the patient has consented to  Procedure(s): 3 CM CYST EXCISION NECK (N/A) as a surgical intervention .  The patient's history has been reviewed, patient examined, no change in status, stable for surgery.  I have reviewed the patient's chart and labs.  Questions were answered to the patient's satisfaction.     [name]

## 2017-03-29 NOTE — Transfer of Care (Signed)
Immediate Anesthesia Transfer of Care Note  Patient: MIKLE STERNBERG  Procedure(s) Performed: Procedure(s): 3 CM CYST EXCISION NECK (N/A)  Patient Location: PACU  Anesthesia Type:General  Level of Consciousness: drowsy  Airway & Oxygen Therapy: Patient Spontanous Breathing and Patient connected to face mask oxygen  Post-op Assessment: Report given to RN  Post vital signs: Reviewed and stable  Last Vitals:  Vitals:   03/29/17 1015 03/29/17 1020  BP: (!) 104/56   Resp: 20 17  Temp:      Last Pain:  Vitals:   03/29/17 0825  TempSrc: Oral  PainSc:       Patients Stated Pain Goal: 8 (03/29/17 0981)  Complications: No apparent anesthesia complications

## 2017-03-30 ENCOUNTER — Encounter (HOSPITAL_COMMUNITY): Payer: Self-pay | Admitting: General Surgery

## 2017-04-13 ENCOUNTER — Ambulatory Visit (INDEPENDENT_AMBULATORY_CARE_PROVIDER_SITE_OTHER): Payer: Self-pay | Admitting: General Surgery

## 2017-04-13 ENCOUNTER — Encounter: Payer: Self-pay | Admitting: General Surgery

## 2017-04-13 VITALS — BP 119/66 | HR 62 | Temp 99.1°F | Resp 18 | Ht 69.0 in | Wt 158.0 lb

## 2017-04-13 DIAGNOSIS — Z09 Encounter for follow-up examination after completed treatment for conditions other than malignant neoplasm: Secondary | ICD-10-CM

## 2017-04-13 NOTE — Progress Notes (Signed)
Subjective:     Kevin Franklin  Status post excision of sebaceous cyst, neck. Doing well. No complaints. Objective:    BP 119/66   Pulse 62   Temp 99.1 F (37.3 C)   Resp 18   Ht 5\' 9"  (1.753 m)   Wt 158 lb (71.7 kg)   BMI 23.33 kg/m   General:  alert, cooperative and no distress  Neck incision healing well. Sutures removed. No drainage noted.     Assessment:    Doing well postoperatively.    Plan:   Follow-up as needed.

## 2017-05-24 ENCOUNTER — Other Ambulatory Visit (HOSPITAL_COMMUNITY): Payer: Self-pay | Admitting: Family Medicine

## 2017-05-24 DIAGNOSIS — M5412 Radiculopathy, cervical region: Secondary | ICD-10-CM

## 2017-06-21 ENCOUNTER — Ambulatory Visit (HOSPITAL_COMMUNITY)
Admission: RE | Admit: 2017-06-21 | Discharge: 2017-06-21 | Disposition: A | Discharge status: Home / Self Care | Payer: PRIVATE HEALTH INSURANCE | Source: Ambulatory Visit | Attending: Family Medicine | Admitting: Family Medicine

## 2017-06-21 DIAGNOSIS — M5412 Radiculopathy, cervical region: Secondary | ICD-10-CM | POA: Insufficient documentation

## 2017-06-21 DIAGNOSIS — M47812 Spondylosis without myelopathy or radiculopathy, cervical region: Secondary | ICD-10-CM | POA: Insufficient documentation

## 2017-07-19 ENCOUNTER — Other Ambulatory Visit (HOSPITAL_COMMUNITY): Payer: Self-pay | Admitting: Family Medicine

## 2017-07-19 DIAGNOSIS — M542 Cervicalgia: Secondary | ICD-10-CM

## 2017-07-26 ENCOUNTER — Ambulatory Visit (HOSPITAL_COMMUNITY): Payer: 59

## 2017-07-29 ENCOUNTER — Ambulatory Visit (HOSPITAL_COMMUNITY)
Admission: RE | Admit: 2017-07-29 | Discharge: 2017-07-29 | Disposition: A | Discharge status: Home / Self Care | Payer: 59 | Source: Ambulatory Visit | Attending: Family Medicine | Admitting: Family Medicine

## 2017-07-29 DIAGNOSIS — M4802 Spinal stenosis, cervical region: Secondary | ICD-10-CM | POA: Diagnosis not present

## 2017-07-29 DIAGNOSIS — M4302 Spondylolysis, cervical region: Secondary | ICD-10-CM | POA: Diagnosis not present

## 2017-07-29 DIAGNOSIS — M542 Cervicalgia: Secondary | ICD-10-CM | POA: Diagnosis present

## 2018-04-22 ENCOUNTER — Other Ambulatory Visit: Payer: Self-pay | Admitting: Neurosurgery

## 2018-04-22 DIAGNOSIS — M5412 Radiculopathy, cervical region: Secondary | ICD-10-CM

## 2018-05-04 ENCOUNTER — Other Ambulatory Visit: Payer: 59

## 2018-05-11 ENCOUNTER — Inpatient Hospital Stay
Admission: RE | Admit: 2018-05-11 | Discharge: 2018-05-11 | Disposition: A | Discharge status: Home / Self Care | Payer: 59 | Source: Ambulatory Visit | Attending: Neurosurgery | Admitting: Neurosurgery

## 2018-05-20 ENCOUNTER — Ambulatory Visit
Admission: RE | Admit: 2018-05-20 | Discharge: 2018-05-20 | Disposition: A | Discharge status: Home / Self Care | Payer: 59 | Source: Ambulatory Visit | Attending: Neurosurgery | Admitting: Neurosurgery

## 2018-05-20 DIAGNOSIS — M5412 Radiculopathy, cervical region: Secondary | ICD-10-CM

## 2018-05-20 MED ORDER — TRIAMCINOLONE ACETONIDE 40 MG/ML IJ SUSP (RADIOLOGY)
60.0000 mg | Freq: Once | INTRAMUSCULAR | Status: AC
  Administered 2018-05-20: 60 mg via EPIDURAL

## 2018-05-20 MED ORDER — IOPAMIDOL (ISOVUE-M 300) INJECTION 61%
1.0000 mL | Freq: Once | INTRAMUSCULAR | Status: AC | PRN
  Administered 2018-05-20: 1 mL via EPIDURAL

## 2018-05-20 NOTE — Discharge Instructions (Signed)

## 2019-12-28 IMAGING — XA DG INJECT/[PERSON_NAME] INC NEEDLE/CATH/PLC EPI/CERV/THOR W/IMG
2 series · 2 of 2 positions shown · non-contrast
Comparison: none

CLINICAL DATA: Cervical radiculopathy. Cervicalgia. Bilateral upper
extremity radiculopathy, left greater than right.

[Series 1: ortho standard · 1 of 1 slices shown (1 of 2)]
[im 1/1]
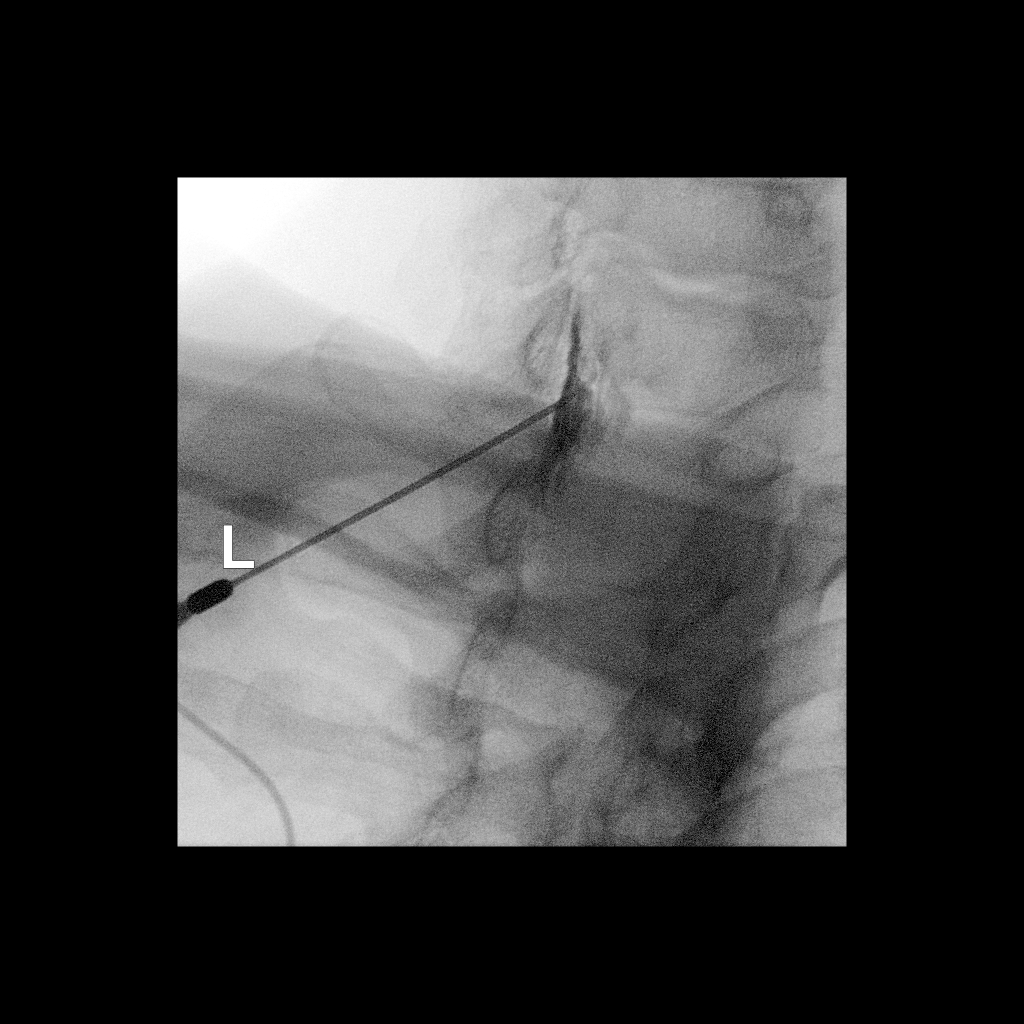

[Series 2: ortho standard · 1 of 1 slices shown (2 of 2)]
[im 1/1]
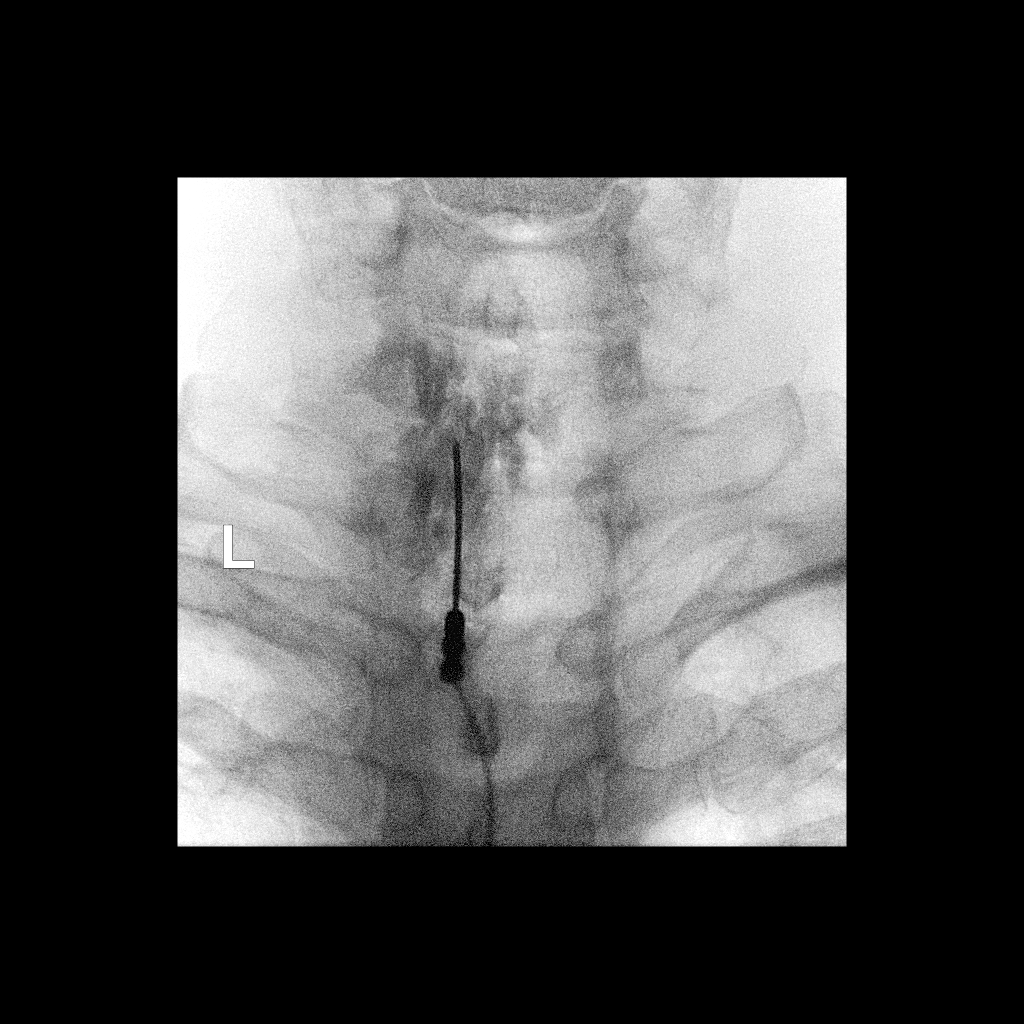

[2 of 2 positions shown; findings below may reference images not displayed]

FLUOROSCOPY TIME:  Radiation Exposure Index (as provided by the
fluoroscopic device): 6.01 uGy*m2

Fluoroscopy Time:  17 seconds

Number of Acquired Images:  0

PROCEDURE:
CERVICAL EPIDURAL INJECTION

An interlaminar approach was performed on the left at C7-T1. A 20
gauge epidural needle was advanced using loss-of-resistance
technique.

DIAGNOSTIC EPIDURAL INJECTION

Injection of Isovue-M 300 shows a good epidural pattern with spread
above and below the level of needle placement, primarily on the
left. No vascular opacification is seen. THERAPEUTIC

EPIDURAL INJECTION

1.5 ml of Kenalog 40 mixed with 1 ml of 1% Lidocaine and 2 ml of
normal saline were then instilled. The procedure was well-tolerated,
and the patient was discharged thirty minutes following the
injection in good condition.
IMPRESSION: Technically successful first epidural injection on the left at
C7-T1.
